# Patient Record
Sex: Female | Born: 1955 | Race: White | Hispanic: No | Marital: Married | Smoking: Never smoker
Health system: Southern US, Community
[De-identification: ages and names within clinical notes are randomized; demographics above are authoritative.]

## PROBLEM LIST (undated history)

## (undated) DIAGNOSIS — M1990 Unspecified osteoarthritis, unspecified site: Secondary | ICD-10-CM

## (undated) DIAGNOSIS — M8580 Other specified disorders of bone density and structure, unspecified site: Secondary | ICD-10-CM

## (undated) DIAGNOSIS — I219 Acute myocardial infarction, unspecified: Secondary | ICD-10-CM

## (undated) DIAGNOSIS — E119 Type 2 diabetes mellitus without complications: Secondary | ICD-10-CM

## (undated) DIAGNOSIS — E785 Hyperlipidemia, unspecified: Secondary | ICD-10-CM

## (undated) DIAGNOSIS — I10 Essential (primary) hypertension: Secondary | ICD-10-CM

## (undated) DIAGNOSIS — E669 Obesity, unspecified: Secondary | ICD-10-CM

## (undated) DIAGNOSIS — J3089 Other allergic rhinitis: Secondary | ICD-10-CM

## (undated) DIAGNOSIS — E039 Hypothyroidism, unspecified: Secondary | ICD-10-CM

## (undated) HISTORY — PX: TOOTH EXTRACTION: SUR596

## (undated) HISTORY — PX: ECTOPIC PREGNANCY SURGERY: SHX613

## (undated) HISTORY — DX: Hyperlipidemia, unspecified: E78.5

## (undated) HISTORY — PX: LAPAROSCOPY: SHX197

## (undated) HISTORY — PX: ABDOMINAL HYSTERECTOMY: SHX81

## (undated) HISTORY — DX: Obesity, unspecified: E66.9

## (undated) HISTORY — PX: DILATION AND CURETTAGE OF UTERUS: SHX78

## (undated) HISTORY — DX: Essential (primary) hypertension: I10

## (undated) HISTORY — DX: Unspecified osteoarthritis, unspecified site: M19.90

## (undated) HISTORY — DX: Other specified disorders of bone density and structure, unspecified site: M85.80

## (undated) HISTORY — DX: Type 2 diabetes mellitus without complications: E11.9

---

## 2004-02-16 ENCOUNTER — Ambulatory Visit: Payer: Self-pay | Admitting: Internal Medicine

## 2004-02-17 ENCOUNTER — Ambulatory Visit: Payer: Self-pay | Admitting: Internal Medicine

## 2007-08-26 ENCOUNTER — Emergency Department: Payer: Self-pay | Admitting: Emergency Medicine

## 2011-06-27 ENCOUNTER — Ambulatory Visit: Payer: Self-pay

## 2014-08-09 DIAGNOSIS — E119 Type 2 diabetes mellitus without complications: Secondary | ICD-10-CM

## 2014-08-09 DIAGNOSIS — E1129 Type 2 diabetes mellitus with other diabetic kidney complication: Secondary | ICD-10-CM | POA: Insufficient documentation

## 2014-08-09 DIAGNOSIS — E669 Obesity, unspecified: Secondary | ICD-10-CM | POA: Insufficient documentation

## 2014-08-09 DIAGNOSIS — R809 Proteinuria, unspecified: Secondary | ICD-10-CM

## 2014-08-09 DIAGNOSIS — M8580 Other specified disorders of bone density and structure, unspecified site: Secondary | ICD-10-CM | POA: Insufficient documentation

## 2014-08-09 DIAGNOSIS — M1990 Unspecified osteoarthritis, unspecified site: Secondary | ICD-10-CM

## 2014-08-09 DIAGNOSIS — E7800 Pure hypercholesterolemia, unspecified: Secondary | ICD-10-CM | POA: Insufficient documentation

## 2014-08-09 DIAGNOSIS — I10 Essential (primary) hypertension: Secondary | ICD-10-CM | POA: Insufficient documentation

## 2014-08-22 ENCOUNTER — Ambulatory Visit (INDEPENDENT_AMBULATORY_CARE_PROVIDER_SITE_OTHER): Payer: BC Managed Care – PPO | Admitting: Unknown Physician Specialty

## 2014-08-22 ENCOUNTER — Encounter: Payer: Self-pay | Admitting: Unknown Physician Specialty

## 2014-08-22 VITALS — BP 119/84 | HR 70 | Temp 98.3°F | Ht 63.2 in | Wt 184.4 lb

## 2014-08-22 DIAGNOSIS — E7800 Pure hypercholesterolemia, unspecified: Secondary | ICD-10-CM

## 2014-08-22 DIAGNOSIS — I10 Essential (primary) hypertension: Secondary | ICD-10-CM

## 2014-08-22 DIAGNOSIS — E119 Type 2 diabetes mellitus without complications: Secondary | ICD-10-CM

## 2014-08-22 NOTE — Progress Notes (Signed)
BP 119/84 mmHg  Pulse 70  Temp(Src) 98.3 F (36.8 C)  Ht 5' 3.2" (1.605 m)  Wt 184 lb 6.4 oz (83.643 kg)  BMI 32.47 kg/m2  SpO2 97%  LMP  (LMP Unknown)   Subjective:    Patient ID: Kim Hancock, female    DOB: 20-Aug-1955, 59 y.o.   MRN: [id number]  HPI: Kim Hancock is a 59 y.o. female  Chief Complaint  Patient presents with  . Diabetes  . Hypertension  . Hyperlipidemia   1.  DIABETES "my morning blood sugars have dropped"   Hypoglycemic episodes:  no  H8  Polydipsia/polyuria:  yes  H8 Visual disturbance:  no  R2  Chest pain:  no  R4  Paresthesias:  no  R10  Glucose Monitoring:  yes   Accucheck frequency:  once daily  Fasting glucose:  120-130s   HGB A1C: 7.1 3 months ago D1  GLUCOSE: 98 Retinal Examination:  Up to date  P1Dr. Clydene Pugh, no retinopathy noted per patient Foot Exam: Foot Exam Done on 07/09/13 P1 Aspirin:  Aspirin Therapy Not applicable on 01/22/10  2.  HYPERTENSION / HYPERLIPIDEMIA Patient is requesting refill(s). Satisfied with current treatment?  yes  H6  BP monitoring frequency:  not checking.  H5  BP medication side effects:  no P1 Duration of hyperlipidemia:  chronic  H4  Cholesterol medication side effects:  no P1  Medication compliance:  excellent compliance  P1  Aspirin:  Aspirin Therapy Not applicable on 01/22/10 Recent stressors:  no  H6   Recurrent headaches:  no  R10 Visual changes:  no  R2  Palpitations:  no  R4  Dyspnea:  no  R5  Chest pain:  no  R4  Lower extremity edema:  no  R4  Dizzy/lightheaded:  no  R10     Relevant past medical, surgical, family and social history reviewed and updated as indicated. Interim medical history since our last visit reviewed. Allergies and medications reviewed and updated.  Review of Systems  Per HPI unless specifically indicated above     Objective:    BP 119/84 mmHg  Pulse 70  Temp(Src) 98.3 F (36.8 C)  Ht 5' 3.2" (1.605 m)  Wt 184 lb 6.4 oz (83.643 kg)  BMI 32.47 kg/m2   SpO2 97%  LMP  (LMP Unknown)  Wt Readings from Last 3 Encounters:  08/22/14 184 lb 6.4 oz (83.643 kg)  05/30/14 189 lb (85.73 kg)    Physical Exam  Constitutional: She is oriented to person, place, and time. She appears well-developed and well-nourished. No distress.  HENT:  Head: Normocephalic and atraumatic.  Eyes: Conjunctivae and lids are normal. Right eye exhibits no discharge. Left eye exhibits no discharge. No scleral icterus.  Cardiovascular: Normal rate, regular rhythm and normal heart sounds.   Pulmonary/Chest: Effort normal and breath sounds normal. No respiratory distress.  Abdominal: Normal appearance. There is no splenomegaly or hepatomegaly.  Musculoskeletal: Normal range of motion.  Neurological: She is alert and oriented to person, place, and time.  Skin: Skin is intact. No rash noted. No pallor.  Psychiatric: She has a normal mood and affect. Her behavior is normal. Judgment and thought content normal.    No results found for this or any previous visit.    Assessment & Plan:   Problem List Items Addressed This Visit      Cardiovascular and Mediastinum   Hypertension     Endocrine   Diabetes mellitus - Primary    Hgb  A1C is 6.9 showing good control      Relevant Orders   Bayer DCA Hb A1c Waived   Comprehensive metabolic panel     Other   Hypercholesterolemia       Follow up plan: Return in about 3 months (around 11/22/2014) for diabetes/Htn/hypercholestoerol.

## 2014-08-22 NOTE — Assessment & Plan Note (Signed)
Hgb A1C is 6.9 showing good control

## 2014-08-23 LAB — BAYER DCA HB A1C WAIVED: HB A1C (BAYER DCA - WAIVED): 6.9 % (ref <7.0)

## 2014-08-23 LAB — COMPREHENSIVE METABOLIC PANEL
ALT: 16 IU/L (ref 0–32)
AST: 16 IU/L (ref 0–40)
Albumin/Globulin Ratio: 2.1 (ref 1.1–2.5)
Albumin: 4.5 g/dL (ref 3.6–4.8)
Alkaline Phosphatase: 52 IU/L (ref 39–117)
BUN / CREAT RATIO: 32 — AB (ref 11–26)
BUN: 29 mg/dL — AB (ref 8–27)
Bilirubin Total: 0.4 mg/dL (ref 0.0–1.2)
CALCIUM: 9.6 mg/dL (ref 8.7–10.3)
CO2: 22 mmol/L (ref 18–29)
CREATININE: 0.91 mg/dL (ref 0.57–1.00)
Chloride: 99 mmol/L (ref 97–108)
GFR calc Af Amer: 79 mL/min/{1.73_m2} (ref >59)
GFR calc non Af Amer: 68 mL/min/{1.73_m2} (ref >59)
Globulin, Total: 2.1 g/dL (ref 1.5–4.5)
Glucose: 107 mg/dL — ABNORMAL HIGH (ref 65–99)
POTASSIUM: 3.7 mmol/L (ref 3.5–5.2)
Sodium: 141 mmol/L (ref 134–144)
Total Protein: 6.6 g/dL (ref 6.0–8.5)

## 2014-08-29 ENCOUNTER — Other Ambulatory Visit: Payer: Self-pay | Admitting: Unknown Physician Specialty

## 2014-09-29 ENCOUNTER — Other Ambulatory Visit: Payer: Self-pay | Admitting: Unknown Physician Specialty

## 2014-10-29 ENCOUNTER — Other Ambulatory Visit: Payer: Self-pay | Admitting: Unknown Physician Specialty

## 2014-11-21 ENCOUNTER — Ambulatory Visit: Payer: BC Managed Care – PPO | Admitting: Unknown Physician Specialty

## 2014-11-23 ENCOUNTER — Encounter: Payer: Self-pay | Admitting: Unknown Physician Specialty

## 2014-11-23 ENCOUNTER — Ambulatory Visit (INDEPENDENT_AMBULATORY_CARE_PROVIDER_SITE_OTHER): Payer: BC Managed Care – PPO | Admitting: Unknown Physician Specialty

## 2014-11-23 VITALS — BP 124/80 | HR 69 | Temp 98.6°F | Ht 63.5 in | Wt 188.8 lb

## 2014-11-23 DIAGNOSIS — E7800 Pure hypercholesterolemia, unspecified: Secondary | ICD-10-CM

## 2014-11-23 DIAGNOSIS — I10 Essential (primary) hypertension: Secondary | ICD-10-CM | POA: Diagnosis not present

## 2014-11-23 DIAGNOSIS — M7521 Bicipital tendinitis, right shoulder: Secondary | ICD-10-CM | POA: Diagnosis not present

## 2014-11-23 DIAGNOSIS — E119 Type 2 diabetes mellitus without complications: Secondary | ICD-10-CM | POA: Diagnosis not present

## 2014-11-23 LAB — MICROALBUMIN, URINE WAIVED
CREATININE, URINE WAIVED: 50 mg/dL (ref 10–300)
Microalb, Ur Waived: 10 mg/L (ref 0–19)

## 2014-11-23 LAB — LIPID PANEL PICCOLO, WAIVED
CHOL/HDL RATIO PICCOLO,WAIVE: 3.5 mg/dL
Cholesterol Piccolo, Waived: 180 mg/dL (ref <200)
HDL Chol Piccolo, Waived: 52 mg/dL — ABNORMAL LOW (ref >59)
LDL CHOL CALC PICCOLO WAIVED: 60 mg/dL (ref <100)
Triglycerides Piccolo,Waived: 343 mg/dL — ABNORMAL HIGH (ref <150)
VLDL Chol Calc Piccolo,Waive: 69 mg/dL — ABNORMAL HIGH (ref <30)

## 2014-11-23 LAB — BAYER DCA HB A1C WAIVED: HB A1C: 7.3 % — AB (ref <7.0)

## 2014-11-23 NOTE — Assessment & Plan Note (Addendum)
Comprehensive metabolic panel ordered results pending Uric acid ordered results pending Stable, continue present medications.    

## 2014-11-23 NOTE — Progress Notes (Signed)
BP 124/80 mmHg  Pulse 69  Temp(Src) 98.6 F (37 C)  Ht 5' 3.5" (1.613 m)  Wt 188 lb 12.8 oz (85.639 kg)  BMI 32.92 kg/m2  SpO2 97%  LMP  (LMP Unknown)   Subjective:    Patient ID: Kim Hancock, female    DOB: November 01, 1955, 59 y.o.   MRN: [id number]  HPI: Kim Hancock is a 59 y.o. female  Chief Complaint  Patient presents with  . Diabetes  . Hyperlipidemia  . Hypertension   Diabetes This is a chronic problem medication compliance is excellent.  She is satisfied with the current treatment.  Fasting blood sugars at home ranging 120's.  Hgb A1C 6.9 last visit.  Pertinent negatives denies hypoglycemic events, chest pain, palpitations, dizziness/lightheadedness, shortness of breath, paresthesias, or polydipsia/polyura  Hypertension/Hyperlipidemia This is a chronic problem medication compliance is excellent.  She is satisfied with the current treatment.  Checks blood pressure at home weekly ranging 120's/70's.  Pertinent negatives denies recurrent headaches, visual changes, palpitations, dyspnea, chest pain, visual changes, edema, or dizziness/lightheadedness  Right Shoulder Pain The pt presents with intermittent right shoulder pain with slight decreased range of motion onset 4 months ago she does not remember injuring the right shoulder. The quality of pain is stabbing she is currently not having any pain. She thinks she may pulled a muscle.  Aggravating factors overuse.  Alleviating factors rest.  The pain is worse in the morning.  She has taken Ibuprofen for the pain unsure if it helps the pain or not. Pertinent negatives denies trauma, swelling, popping/clicking of right shoulder, or  numbness/tingling  Relevant past medical, surgical, family and social history reviewed and updated as indicated. Interim medical history since our last visit reviewed. Allergies and medications reviewed and updated.  Review of Systems  Constitutional: Negative.   HENT: Negative.   Eyes: Negative.    Respiratory: Positive for cough. Negative for apnea, choking, chest tightness, shortness of breath, wheezing and stridor.   Cardiovascular: Negative.   Gastrointestinal: Negative.   Endocrine: Negative.   Genitourinary: Negative.   Musculoskeletal: Positive for arthralgias. Negative for neck pain and neck stiffness.  Skin: Negative.   Allergic/Immunologic: Negative.   Neurological: Negative.   Hematological: Negative.   Psychiatric/Behavioral: Negative.     Per HPI unless specifically indicated above     Objective:    BP 124/80 mmHg  Pulse 69  Temp(Src) 98.6 F (37 C)  Ht 5' 3.5" (1.613 m)  Wt 188 lb 12.8 oz (85.639 kg)  BMI 32.92 kg/m2  SpO2 97%  LMP  (LMP Unknown)  Wt Readings from Last 3 Encounters:  11/23/14 188 lb 12.8 oz (85.639 kg)  08/22/14 184 lb 6.4 oz (83.643 kg)  05/30/14 189 lb (85.73 kg)    Physical Exam  Constitutional: She is oriented to person, place, and time. She appears well-developed and well-nourished. No distress.  HENT:  Head: Normocephalic and atraumatic.  Right Ear: External ear normal.  Left Ear: External ear normal.  Nose: Nose normal.  Neck: Normal range of motion. Neck supple.  Cardiovascular: Normal rate, regular rhythm, normal heart sounds and intact distal pulses.   Pulmonary/Chest: Effort normal and breath sounds normal. No respiratory distress. She has no wheezes. She has no rales. She exhibits no tenderness.  Musculoskeletal: She exhibits no edema.  Neer's and Empty Can Test positive, tenderness present with abduction and adduction  Neurological: She is alert and oriented to person, place, and time.  Skin: Skin is warm and dry.  No rash noted. She is not diaphoretic. No erythema. No pallor.  Psychiatric: She has a normal mood and affect. Her behavior is normal. Judgment and thought content normal.    Results for orders placed or performed in visit on 11/23/14  Lipid Panel Piccolo, [employer]  Result Value Ref Range   Cholesterol  Piccolo, Waived 180 <200 mg/dL   HDL Chol Piccolo, Waived 52 (L) >59 mg/dL   Triglycerides Piccolo,Waived 343 (H) <150 mg/dL   Chol/HDL Ratio Piccolo,Waive 3.5 mg/dL   LDL Chol Calc Piccolo Waived 60 <100 mg/dL   VLDL Chol Calc Piccolo,Waive 69 (H) <30 mg/dL  Bayer DCA Hb Z6X Waived  Result Value Ref Range   Bayer DCA Hb A1c Waived 7.3 (H) <7.0 %  Microalbumin, Urine Waived  Result Value Ref Range   Microalb, Ur Waived 10 0 - 19 mg/L   Creatinine, Urine Waived 50 10 - 300 mg/dL   Microalb/Creat Ratio <30 <30 mg/g      Assessment & Plan:   Problem List Items Addressed This Visit      Unprioritized   Diabetes mellitus (HCC) - Primary    Hgb A1C reviewed results 7.3 will continue to limit carbohydrate intake Continue current medication      Hypertension    Comprehensive metabolic panel ordered results pending Uric acid ordered results pending      Hypercholesterolemia    Lipid panel results reviewed LDL 60Total Cholesterol 180 Stable, continue present medications.        Biceps tendonitis on right    Pt agreeable to look up stretching exercises online to perform at home  If symptoms do not improve will Refer to Physical Therapy May continue using otc NSAID's as needed for pain       Other Visit Diagnoses    Type 2 diabetes mellitus without complication (HCC)            Follow up plan: Return in about 3 months (around 02/23/2015).

## 2014-11-23 NOTE — Assessment & Plan Note (Signed)
Pt agreeable to look up stretching exercises online to perform at home  If symptoms do not improve will Refer to Physical Therapy May continue using otc NSAID's as needed for pain

## 2014-11-23 NOTE — Assessment & Plan Note (Addendum)
Hgb A1C reviewed results 7.3 will continue to limit carbohydrate intake Continue current medication

## 2014-11-23 NOTE — Assessment & Plan Note (Addendum)
Lipid panel results reviewed LDL 60Total Cholesterol 180 Stable, continue present medications.

## 2014-11-24 LAB — COMPREHENSIVE METABOLIC PANEL
ALT: 28 IU/L (ref 0–32)
AST: 19 IU/L (ref 0–40)
Albumin/Globulin Ratio: 2.2 (ref 1.1–2.5)
Albumin: 4.4 g/dL (ref 3.6–4.8)
Alkaline Phosphatase: 54 IU/L (ref 39–117)
BILIRUBIN TOTAL: 0.4 mg/dL (ref 0.0–1.2)
BUN / CREAT RATIO: 36 — AB (ref 11–26)
BUN: 24 mg/dL (ref 8–27)
CALCIUM: 9.6 mg/dL (ref 8.7–10.3)
CO2: 26 mmol/L (ref 18–29)
Chloride: 98 mmol/L (ref 97–106)
Creatinine, Ser: 0.67 mg/dL (ref 0.57–1.00)
GFR calc Af Amer: 110 mL/min/{1.73_m2} (ref >59)
GFR calc non Af Amer: 95 mL/min/{1.73_m2} (ref >59)
GLUCOSE: 192 mg/dL — AB (ref 65–99)
Globulin, Total: 2 g/dL (ref 1.5–4.5)
POTASSIUM: 3.7 mmol/L (ref 3.5–5.2)
SODIUM: 138 mmol/L (ref 136–144)
TOTAL PROTEIN: 6.4 g/dL (ref 6.0–8.5)

## 2014-11-24 LAB — URIC ACID: URIC ACID: 3.6 mg/dL (ref 2.5–7.1)

## 2014-11-26 ENCOUNTER — Other Ambulatory Visit: Payer: Self-pay | Admitting: Unknown Physician Specialty

## 2014-12-24 ENCOUNTER — Other Ambulatory Visit: Payer: Self-pay | Admitting: Unknown Physician Specialty

## 2014-12-24 ENCOUNTER — Other Ambulatory Visit: Payer: Self-pay | Admitting: Family Medicine

## 2014-12-26 NOTE — Telephone Encounter (Signed)
Your patient 

## 2015-01-19 ENCOUNTER — Other Ambulatory Visit: Payer: Self-pay | Admitting: Unknown Physician Specialty

## 2015-02-24 ENCOUNTER — Other Ambulatory Visit: Payer: Self-pay | Admitting: Unknown Physician Specialty

## 2015-02-24 ENCOUNTER — Other Ambulatory Visit: Payer: Self-pay | Admitting: Family Medicine

## 2015-02-24 NOTE — Telephone Encounter (Signed)
You patient.

## 2015-03-01 ENCOUNTER — Ambulatory Visit: Payer: BC Managed Care – PPO | Admitting: Unknown Physician Specialty

## 2015-03-03 ENCOUNTER — Encounter: Payer: Self-pay | Admitting: Unknown Physician Specialty

## 2015-03-03 ENCOUNTER — Ambulatory Visit (INDEPENDENT_AMBULATORY_CARE_PROVIDER_SITE_OTHER): Payer: BC Managed Care – PPO | Admitting: Unknown Physician Specialty

## 2015-03-03 VITALS — BP 117/78 | HR 84 | Temp 98.3°F | Ht 63.6 in | Wt 184.6 lb

## 2015-03-03 DIAGNOSIS — Z23 Encounter for immunization: Secondary | ICD-10-CM

## 2015-03-03 DIAGNOSIS — E119 Type 2 diabetes mellitus without complications: Secondary | ICD-10-CM

## 2015-03-03 DIAGNOSIS — I10 Essential (primary) hypertension: Secondary | ICD-10-CM | POA: Diagnosis not present

## 2015-03-03 DIAGNOSIS — E7800 Pure hypercholesterolemia, unspecified: Secondary | ICD-10-CM

## 2015-03-03 LAB — BAYER DCA HB A1C WAIVED: HB A1C (BAYER DCA - WAIVED): 7.5 % — ABNORMAL HIGH (ref <7.0)

## 2015-03-03 MED ORDER — EMPAGLIFLOZIN 25 MG PO TABS: 25.0000 mg | ORAL_TABLET | Freq: Every day | ORAL | Status: DC

## 2015-03-03 NOTE — Progress Notes (Signed)
BP 117/78 mmHg  Pulse 84  Temp(Src) 98.3 F (36.8 C)  Ht 5' 3.6" (1.615 m)  Wt 184 lb 9.6 oz (83.734 kg)  BMI 32.10 kg/m2  SpO2 95%  LMP  (LMP Unknown)   Subjective:    Patient ID: Kim Hancock, female    DOB: 05/27/1955, 60 y.o.   MRN: [id number]  HPI: Kim Hancock is a 60 y.o. female  Chief Complaint  Patient presents with  . Diabetes  . Hyperlipidemia  . Hypertension   Diabetes:  Using medications without difficulties: Hypoglycemic episodes Hyperglycemic episodes Feet problems: normal Blood Sugars averaging: "all over the place" Eye exam within last year: Last A1c: 7.3  Hypertension:    Using medication without problems or lightheadedness  No nhest pain with exertion: No edema No shortness of breath Average home BPs:   Elevated Cholesterol: Using medications without problems: No muscle aches  Diet compliance: Exercise: Supplements?     Relevant past medical, surgical, family and social history reviewed and updated as indicated. Interim medical history since our last visit reviewed. Allergies and medications reviewed and updated.  Review of Systems  Constitutional: Negative.   HENT: Negative.   Eyes: Negative.   Respiratory: Negative.   Cardiovascular: Negative.   Gastrointestinal: Negative.   Endocrine: Negative.   Genitourinary: Negative.   Musculoskeletal: Negative.   Skin: Negative.   Allergic/Immunologic: Negative.   Neurological: Negative.   Hematological: Negative.   Psychiatric/Behavioral: Negative.     Per HPI unless specifically indicated above     Objective:    BP 117/78 mmHg  Pulse 84  Temp(Src) 98.3 F (36.8 C)  Ht 5' 3.6" (1.615 m)  Wt 184 lb 9.6 oz (83.734 kg)  BMI 32.10 kg/m2  SpO2 95%  LMP  (LMP Unknown)  Wt Readings from Last 3 Encounters:  03/03/15 184 lb 9.6 oz (83.734 kg)  11/23/14 188 lb 12.8 oz (85.639 kg)  08/22/14 184 lb 6.4 oz (83.643 kg)    Physical Exam  Constitutional: She is oriented to  person, place, and time. She appears well-developed and well-nourished. No distress.  HENT:  Head: Normocephalic and atraumatic.  Eyes: Conjunctivae and lids are normal. Right eye exhibits no discharge. Left eye exhibits no discharge. No scleral icterus.  Neck: Normal range of motion. Neck supple. No JVD present. Carotid bruit is not present.  Cardiovascular: Normal rate, regular rhythm and normal heart sounds.   Pulmonary/Chest: Effort normal and breath sounds normal.  Abdominal: Normal appearance. There is no splenomegaly or hepatomegaly.  Musculoskeletal: Normal range of motion.  Neurological: She is alert and oriented to person, place, and time.  Skin: Skin is warm, dry and intact. No rash noted. No pallor.  Psychiatric: She has a normal mood and affect. Her behavior is normal. Judgment and thought content normal.    Results for orders placed or performed in visit on 11/23/14  Lipid Panel Piccolo, [employer]  Result Value Ref Range   Cholesterol Piccolo, Waived 180 <200 mg/dL   HDL Chol Piccolo, Waived 52 (L) >59 mg/dL   Triglycerides Piccolo,Waived 343 (H) <150 mg/dL   Chol/HDL Ratio Piccolo,Waive 3.5 mg/dL   LDL Chol Calc Piccolo Waived 60 <100 mg/dL   VLDL Chol Calc Piccolo,Waive 69 (H) <30 mg/dL  Bayer DCA Hb H0Q Waived  Result Value Ref Range   Bayer DCA Hb A1c Waived 7.3 (H) <7.0 %  Comprehensive metabolic panel  Result Value Ref Range   Glucose 192 (H) 65 - 99 mg/dL   BUN  24 8 - 27 mg/dL   Creatinine, Ser 1.61 0.57 - 1.00 mg/dL   GFR calc non Af Amer 95 >59 mL/min/1.73   GFR calc Af Amer 110 >59 mL/min/1.73   BUN/Creatinine Ratio 36 (H) 11 - 26   Sodium 138 136 - 144 mmol/L   Potassium 3.7 3.5 - 5.2 mmol/L   Chloride 98 97 - 106 mmol/L   CO2 26 18 - 29 mmol/L   Calcium 9.6 8.7 - 10.3 mg/dL   Total Protein 6.4 6.0 - 8.5 g/dL   Albumin 4.4 3.6 - 4.8 g/dL   Globulin, Total 2.0 1.5 - 4.5 g/dL   Albumin/Globulin Ratio 2.2 1.1 - 2.5   Bilirubin Total 0.4 0.0 - 1.2 mg/dL    Alkaline Phosphatase 54 39 - 117 IU/L   AST 19 0 - 40 IU/L   ALT 28 0 - 32 IU/L  Microalbumin, Urine Waived  Result Value Ref Range   Microalb, Ur Waived 10 0 - 19 mg/L   Creatinine, Urine Waived 50 10 - 300 mg/dL   Microalb/Creat Ratio <30 <30 mg/g  Uric acid  Result Value Ref Range   Uric Acid 3.6 2.5 - 7.1 mg/dL      Assessment & Plan:   Problem List Items Addressed This Visit      Unprioritized   Diabetes mellitus (HCC)    Hgb A1C is 7.5.  Need to change from [place] to [place] for insurance reasons.  Unable to increase Metformin.  Recheck in 3 months      Relevant Medications   empagliflozin (JARDIANCE) 25 MG TABS tablet   Other Relevant Orders   Comprehensive metabolic panel   Bayer DCA Hb W9U Waived   Hypertension   Relevant Orders   Comprehensive metabolic panel   Hypercholesterolemia    Other Visit Diagnoses    Need for shingles vaccine    -  Primary    Relevant Orders    Varicella-zoster vaccine subcutaneous (Completed)        Follow up plan: Return in about 3 months (around 06/01/2015).

## 2015-03-03 NOTE — Assessment & Plan Note (Addendum)
Hgb A1C is 7.5.  Need to change from [place] to [place] for insurance reasons.  Unable to increase Metformin.  Recheck in 3 months

## 2015-03-04 LAB — COMPREHENSIVE METABOLIC PANEL
A/G RATIO: 1.9 (ref 1.1–2.5)
ALBUMIN: 4.2 g/dL (ref 3.6–4.8)
ALK PHOS: 50 IU/L (ref 39–117)
ALT: 21 IU/L (ref 0–32)
AST: 21 IU/L (ref 0–40)
BUN / CREAT RATIO: 25 (ref 11–26)
BUN: 21 mg/dL (ref 8–27)
Bilirubin Total: 0.5 mg/dL (ref 0.0–1.2)
CO2: 26 mmol/L (ref 18–29)
Calcium: 9.8 mg/dL (ref 8.7–10.3)
Chloride: 99 mmol/L (ref 96–106)
Creatinine, Ser: 0.84 mg/dL (ref 0.57–1.00)
GFR calc Af Amer: 87 mL/min/{1.73_m2} (ref >59)
GFR calc non Af Amer: 75 mL/min/{1.73_m2} (ref >59)
GLOBULIN, TOTAL: 2.2 g/dL (ref 1.5–4.5)
Glucose: 206 mg/dL — ABNORMAL HIGH (ref 65–99)
POTASSIUM: 4.2 mmol/L (ref 3.5–5.2)
SODIUM: 142 mmol/L (ref 134–144)
Total Protein: 6.4 g/dL (ref 6.0–8.5)

## 2015-03-18 ENCOUNTER — Other Ambulatory Visit: Payer: Self-pay | Admitting: Unknown Physician Specialty

## 2015-05-18 ENCOUNTER — Other Ambulatory Visit: Payer: Self-pay | Admitting: Unknown Physician Specialty

## 2015-05-29 ENCOUNTER — Other Ambulatory Visit: Payer: Self-pay | Admitting: Unknown Physician Specialty

## 2015-06-02 ENCOUNTER — Ambulatory Visit: Payer: BC Managed Care – PPO | Admitting: Unknown Physician Specialty

## 2015-06-05 ENCOUNTER — Encounter: Payer: Self-pay | Admitting: Unknown Physician Specialty

## 2015-06-05 ENCOUNTER — Ambulatory Visit (INDEPENDENT_AMBULATORY_CARE_PROVIDER_SITE_OTHER): Payer: BC Managed Care – PPO | Admitting: Unknown Physician Specialty

## 2015-06-05 VITALS — BP 120/84 | HR 93 | Temp 98.2°F | Ht 63.3 in | Wt 180.2 lb

## 2015-06-05 DIAGNOSIS — E119 Type 2 diabetes mellitus without complications: Secondary | ICD-10-CM | POA: Diagnosis not present

## 2015-06-05 DIAGNOSIS — Z0000 Encounter for general adult medical examination without abnormal findings: Secondary | ICD-10-CM

## 2015-06-05 DIAGNOSIS — E7800 Pure hypercholesterolemia, unspecified: Secondary | ICD-10-CM | POA: Diagnosis not present

## 2015-06-05 DIAGNOSIS — I10 Essential (primary) hypertension: Secondary | ICD-10-CM | POA: Diagnosis not present

## 2015-06-05 MED ORDER — LEVOTHYROXINE SODIUM 100 MCG PO TABS: 100.0000 ug | ORAL_TABLET | Freq: Every day | ORAL | Status: DC

## 2015-06-05 MED ORDER — ROSUVASTATIN CALCIUM 20 MG PO TABS: 20.0000 mg | ORAL_TABLET | Freq: Every day | ORAL | Status: DC

## 2015-06-05 MED ORDER — EMPAGLIFLOZIN 25 MG PO TABS: 25.0000 mg | ORAL_TABLET | Freq: Every day | ORAL | Status: DC

## 2015-06-05 MED ORDER — METFORMIN HCL 500 MG PO TABS: 500.0000 mg | ORAL_TABLET | Freq: Two times a day (BID) | ORAL | Status: DC

## 2015-06-05 NOTE — Assessment & Plan Note (Signed)
Non fasting.  Lipid sent out of the office

## 2015-06-05 NOTE — Assessment & Plan Note (Signed)
Hgb A1C 7.4 but pt feels great and losing weight.  Will just recheck in another 3 months

## 2015-06-05 NOTE — Progress Notes (Signed)
BP 120/84 mmHg  Pulse 93  Temp(Src) 98.2 F (36.8 C)  Ht 5' 3.3" (1.608 m)  Wt 180 lb 3.2 oz (81.738 kg)  BMI 31.61 kg/m2  SpO2 98%  LMP  (LMP Unknown)   Subjective:    Patient ID: Kim Hancock, female    DOB: [birth year], 60 y.o.   MRN: [id number]  HPI: Kim Hancock is a 60 y.o. female  Chief Complaint  Patient presents with  . Diabetes  . Hyperlipidemia  . Hypertension  . Labs Only    pt states she is interested in HIV and Hep C screening, orders entered   Diabetes:  Using medications without difficulties No hypoglycemic episodes No hyperglycemic episodes Feet problems: none Blood Sugars averaging: 110 last 2 mornings eye exam within last year up to date  Hypertension:  Using medications without difficulty Average home BPs "about what they are here"  Using medication without problems or lightheadedness No chest pain with exertion or shortness of breath No Edema   Elevated Cholesterol: Using medications without problems No Muscle aches Diet compliance: Watching what she eats Exercise: Using a recumbent bike   Relevant past medical, surgical, family and social history reviewed and updated as indicated. Interim medical history since our last visit reviewed. Allergies and medications reviewed and updated.  Review of Systems  Per HPI unless specifically indicated above     Objective:    BP 120/84 mmHg  Pulse 93  Temp(Src) 98.2 F (36.8 C)  Ht 5' 3.3" (1.608 m)  Wt 180 lb 3.2 oz (81.738 kg)  BMI 31.61 kg/m2  SpO2 98%  LMP  (LMP Unknown)  Wt Readings from Last 3 Encounters:  06/05/15 180 lb 3.2 oz (81.738 kg)  03/03/15 184 lb 9.6 oz (83.734 kg)  11/23/14 188 lb 12.8 oz (85.639 kg)    Physical Exam  Constitutional: She is oriented to person, place, and time. She appears well-developed and well-nourished. No distress.  HENT:  Head: Normocephalic and atraumatic.  Eyes: Conjunctivae and lids are normal. Right eye exhibits no discharge. Left eye  exhibits no discharge. No scleral icterus.  Neck: Normal range of motion. Neck supple. No JVD present. Carotid bruit is not present.  Cardiovascular: Normal rate, regular rhythm and normal heart sounds.   Pulmonary/Chest: Effort normal and breath sounds normal.  Abdominal: Normal appearance. There is no splenomegaly or hepatomegaly.  Musculoskeletal: Normal range of motion.  Neurological: She is alert and oriented to person, place, and time.  Skin: Skin is warm, dry and intact. No rash noted. No pallor.  Psychiatric: She has a normal mood and affect. Her behavior is normal. Judgment and thought content normal.   Diabetic Foot Exam - Simple   Simple Foot Form  Diabetic Foot exam was performed with the following findings:  Yes 06/05/2015  2:01 PM  Visual Inspection  No deformities, no ulcerations, no other skin breakdown bilaterally:  Yes  Sensation Testing  Intact to touch and monofilament testing bilaterally:  Yes  Pulse Check  Posterior Tibialis and Dorsalis pulse intact bilaterally:  Yes  Comments       Results for orders placed or performed in visit on 03/03/15  Comprehensive metabolic panel  Result Value Ref Range   Glucose 206 (H) 65 - 99 mg/dL   BUN 21 8 - 27 mg/dL   Creatinine, Ser 4.090.84 0.57 - 1.00 mg/dL   GFR calc non Af Amer 75 >59 mL/min/1.73   GFR calc Af Amer 87 >59 mL/min/1.73   BUN/Creatinine  Ratio 25 11 - 26   Sodium 142 134 - 144 mmol/L   Potassium 4.2 3.5 - 5.2 mmol/L   Chloride 99 96 - 106 mmol/L   CO2 26 18 - 29 mmol/L   Calcium 9.8 8.7 - 10.3 mg/dL   Total Protein 6.4 6.0 - 8.5 g/dL   Albumin 4.2 3.6 - 4.8 g/dL   Globulin, Total 2.2 1.5 - 4.5 g/dL   Albumin/Globulin Ratio 1.9 1.1 - 2.5   Bilirubin Total 0.5 0.0 - 1.2 mg/dL   Alkaline Phosphatase 50 39 - 117 IU/L   AST 21 0 - 40 IU/L   ALT 21 0 - 32 IU/L  Bayer DCA Hb A1c Waived  Result Value Ref Range   Bayer DCA Hb A1c Waived 7.5 (H) <7.0 %      Assessment & Plan:   Problem List Items Addressed  This Visit      Unprioritized   Diabetes mellitus (HCC)    Hgb A1C 7.4 but pt feels great and losing weight.  Will just recheck in another 3 months      Relevant Medications   empagliflozin (JARDIANCE) 25 MG TABS tablet   metFORMIN (GLUCOPHAGE) 500 MG tablet   rosuvastatin (CRESTOR) 20 MG tablet   Other Relevant Orders   Comprehensive metabolic panel   Bayer DCA Hb Z6X Waived   Hypercholesterolemia    Non fasting.  Lipid sent out of the office      Relevant Medications   rosuvastatin (CRESTOR) 20 MG tablet   Other Relevant Orders   Lipid Panel Piccolo, Waived   Hypertension    Stable, continue present medications.        Relevant Medications   rosuvastatin (CRESTOR) 20 MG tablet   Other Relevant Orders   Comprehensive metabolic panel    Other Visit Diagnoses    Health care maintenance    -  Primary    Relevant Orders    Hepatitis C antibody    HIV antibody        Follow up plan: Return in about 3 months (around 09/05/2015) for TSH, Lipid, A1C and CMP next visit.

## 2015-06-05 NOTE — Assessment & Plan Note (Signed)
Stable, continue present medications.   

## 2015-06-06 LAB — COMPREHENSIVE METABOLIC PANEL
A/G RATIO: 2 (ref 1.2–2.2)
ALBUMIN: 4.4 g/dL (ref 3.6–4.8)
ALK PHOS: 53 IU/L (ref 39–117)
ALT: 19 IU/L (ref 0–32)
AST: 19 IU/L (ref 0–40)
BILIRUBIN TOTAL: 0.5 mg/dL (ref 0.0–1.2)
BUN / CREAT RATIO: 33 — AB (ref 12–28)
BUN: 22 mg/dL (ref 8–27)
CO2: 21 mmol/L (ref 18–29)
CREATININE: 0.66 mg/dL (ref 0.57–1.00)
Calcium: 9.9 mg/dL (ref 8.7–10.3)
Chloride: 98 mmol/L (ref 96–106)
GFR calc Af Amer: 110 mL/min/{1.73_m2} (ref >59)
GFR calc non Af Amer: 96 mL/min/{1.73_m2} (ref >59)
GLOBULIN, TOTAL: 2.2 g/dL (ref 1.5–4.5)
Glucose: 216 mg/dL — ABNORMAL HIGH (ref 65–99)
POTASSIUM: 3.7 mmol/L (ref 3.5–5.2)
SODIUM: 143 mmol/L (ref 134–144)
Total Protein: 6.6 g/dL (ref 6.0–8.5)

## 2015-06-06 LAB — HIV ANTIBODY (ROUTINE TESTING W REFLEX): HIV Screen 4th Generation wRfx: NONREACTIVE

## 2015-06-06 LAB — HEPATITIS C ANTIBODY

## 2015-06-07 LAB — LIPID PANEL PICCOLO, WAIVED

## 2015-06-07 LAB — LIPID PANEL W/O CHOL/HDL RATIO
CHOLESTEROL TOTAL: 184 mg/dL (ref 100–199)
HDL: 47 mg/dL (ref >39)
LDL CALC: 60 mg/dL (ref 0–99)
TRIGLYCERIDES: 387 mg/dL — AB (ref 0–149)
VLDL Cholesterol Cal: 77 mg/dL — ABNORMAL HIGH (ref 5–40)

## 2015-06-07 LAB — SPECIMEN STATUS REPORT

## 2015-06-08 LAB — BAYER DCA HB A1C WAIVED: HB A1C: 7.4 % — AB (ref <7.0)

## 2015-08-16 ENCOUNTER — Other Ambulatory Visit: Payer: Self-pay | Admitting: Unknown Physician Specialty

## 2015-09-06 ENCOUNTER — Ambulatory Visit: Payer: BC Managed Care – PPO | Admitting: Unknown Physician Specialty

## 2015-09-18 ENCOUNTER — Ambulatory Visit: Payer: BC Managed Care – PPO | Admitting: Unknown Physician Specialty

## 2015-11-01 ENCOUNTER — Ambulatory Visit (INDEPENDENT_AMBULATORY_CARE_PROVIDER_SITE_OTHER): Payer: BC Managed Care – PPO | Admitting: Unknown Physician Specialty

## 2015-11-01 ENCOUNTER — Other Ambulatory Visit: Payer: Self-pay

## 2015-11-01 ENCOUNTER — Encounter: Payer: Self-pay | Admitting: Unknown Physician Specialty

## 2015-11-01 VITALS — BP 135/87 | HR 65 | Temp 98.1°F | Ht 63.5 in | Wt 179.4 lb

## 2015-11-01 DIAGNOSIS — I10 Essential (primary) hypertension: Secondary | ICD-10-CM | POA: Diagnosis not present

## 2015-11-01 DIAGNOSIS — E119 Type 2 diabetes mellitus without complications: Secondary | ICD-10-CM

## 2015-11-01 DIAGNOSIS — Z0000 Encounter for general adult medical examination without abnormal findings: Secondary | ICD-10-CM

## 2015-11-01 LAB — HEMOGLOBIN A1C: Hemoglobin A1C: 6.8

## 2015-11-01 LAB — BAYER DCA HB A1C WAIVED: HB A1C: 6.8 % (ref <7.0)

## 2015-11-01 NOTE — Patient Instructions (Signed)
Please do call to schedule your mammogram; the number to schedule one at either [facility] or [facility] is [phone]   

## 2015-11-01 NOTE — Assessment & Plan Note (Signed)
Hgb A1C 6.8.  Continue present meds

## 2015-11-01 NOTE — Progress Notes (Signed)
BP 135/87 (BP Location: Left Arm, Patient Position: Sitting, Cuff Size: Large)   Pulse 65   Temp 98.1 F (36.7 C)   Ht 5' 3.5" (1.613 m)   Wt 179 lb 6.4 oz (81.4 kg)   LMP  (LMP Unknown)   SpO2 97%   BMI 31.28 kg/m    Subjective:    Patient ID: Kim Hancock, female    DOB: Aug 13, 1955, 60 y.o.   MRN: [id number]  HPI: Kim Hancock is a 60 y.o. female  Chief Complaint  Patient presents with  . Diabetes    pt states last eye exam in chart is correct   . Hyperlipidemia  . Hypertension  . URI    pt states she has been having congestion, cough, sinus pressure, and headache. States sympotms started Monday.    Diabetes: Using medications without difficulties No hypoglycemic episodes No hyperglycemic episodes Feet problems: none Blood Sugars averaging: high last couple of weeks eye exam within last year Last Hgb A1C: 7.4  Hypertension  Using medications without difficulty Average home BPs 120s/70s  Using medication without problems or lightheadedness No chest pain with exertion or shortness of breath No Edema  Elevated Cholesterol Using medications without problems No Muscle aches  Diet: No change Exercise: Walking and using recumbant  Relevant past medical, surgical, family and social history reviewed and updated as indicated. Interim medical history since our last visit reviewed. Allergies and medications reviewed and updated.  Review of Systems  Per HPI unless specifically indicated above     Objective:    BP 135/87 (BP Location: Left Arm, Patient Position: Sitting, Cuff Size: Large)   Pulse 65   Temp 98.1 F (36.7 C)   Ht 5' 3.5" (1.613 m)   Wt 179 lb 6.4 oz (81.4 kg)   LMP  (LMP Unknown)   SpO2 97%   BMI 31.28 kg/m   Wt Readings from Last 3 Encounters:  11/01/15 179 lb 6.4 oz (81.4 kg)  06/05/15 180 lb 3.2 oz (81.7 kg)  03/03/15 184 lb 9.6 oz (83.7 kg)    Physical Exam  Constitutional: She is oriented to person, place, and time. She appears  well-developed and well-nourished. No distress.  HENT:  Head: Normocephalic and atraumatic.  Eyes: Conjunctivae and lids are normal. Right eye exhibits no discharge. Left eye exhibits no discharge. No scleral icterus.  Neck: Normal range of motion. Neck supple. No JVD present. Carotid bruit is not present.  Cardiovascular: Normal rate, regular rhythm and normal heart sounds.   Pulmonary/Chest: Effort normal and breath sounds normal.  Abdominal: Normal appearance. There is no splenomegaly or hepatomegaly.  Musculoskeletal: Normal range of motion.  Neurological: She is alert and oriented to person, place, and time.  Skin: Skin is warm, dry and intact. No rash noted. No pallor.  Psychiatric: She has a normal mood and affect. Her behavior is normal. Judgment and thought content normal.    Results for orders placed or performed in visit on 06/05/15  Hepatitis C antibody  Result Value Ref Range   Hep C Virus Ab <0.1 0.0 - 0.9 s/co ratio  HIV antibody  Result Value Ref Range   HIV Screen 4th Generation wRfx Non Reactive Non Reactive  Comprehensive metabolic panel  Result Value Ref Range   Glucose 216 (H) 65 - 99 mg/dL   BUN 22 8 - 27 mg/dL   Creatinine, Ser 0.860.66 0.57 - 1.00 mg/dL   GFR calc non Af Amer 96 >59 mL/min/1.73   GFR  calc Af Amer 110 >59 mL/min/1.73   BUN/Creatinine Ratio 33 (H) 12 - 28   Sodium 143 134 - 144 mmol/L   Potassium 3.7 3.5 - 5.2 mmol/L   Chloride 98 96 - 106 mmol/L   CO2 21 18 - 29 mmol/L   Calcium 9.9 8.7 - 10.3 mg/dL   Total Protein 6.6 6.0 - 8.5 g/dL   Albumin 4.4 3.6 - 4.8 g/dL   Globulin, Total 2.2 1.5 - 4.5 g/dL   Albumin/Globulin Ratio 2.0 1.2 - 2.2   Bilirubin Total 0.5 0.0 - 1.2 mg/dL   Alkaline Phosphatase 53 39 - 117 IU/L   AST 19 0 - 40 IU/L   ALT 19 0 - 32 IU/L  Bayer DCA Hb A1c Waived  Result Value Ref Range   Bayer DCA Hb A1c Waived 7.4 (H) <7.0 %  Lipid Panel Piccolo, Waived  Result Value Ref Range   Cholesterol Piccolo, Waived WILL  FOLLOW    HDL Chol Piccolo, Waived WILL FOLLOW    Triglycerides Piccolo,Waived WILL FOLLOW    Chol/HDL Ratio Piccolo,Waive WILL FOLLOW    LDL Chol Calc Piccolo Waived WILL FOLLOW    VLDL Chol Calc Piccolo,Waive WILL FOLLOW   Lipid Panel w/o Chol/HDL Ratio  Result Value Ref Range   Cholesterol, Total 184 100 - 199 mg/dL   Triglycerides 161 (H) 0 - 149 mg/dL   HDL 47 >09 mg/dL   VLDL Cholesterol Cal 77 (H) 5 - 40 mg/dL   LDL Calculated 60 0 - 99 mg/dL  Specimen status report  Result Value Ref Range   specimen status report Comment       Assessment & Plan:   Problem List Items Addressed This Visit      Unprioritized   Diabetes mellitus (HCC) - Primary    Hgb A1C 6.8.  Continue present meds      Relevant Orders   Bayer DCA Hb A1c Waived   Comprehensive metabolic panel   Hypertension    Stable, continue present medications.         Other Visit Diagnoses    Routine general medical examination at a health care facility       Relevant Orders   MM DIGITAL SCREENING BILATERAL       Follow up plan: Return in about 3 months (around 01/31/2016) for physica.

## 2015-11-01 NOTE — Assessment & Plan Note (Signed)
Stable, continue present medications.   

## 2015-11-02 LAB — COMPREHENSIVE METABOLIC PANEL
ALK PHOS: 65 IU/L (ref 39–117)
ALT: 14 IU/L (ref 0–32)
AST: 14 IU/L (ref 0–40)
Albumin/Globulin Ratio: 1.9 (ref 1.2–2.2)
Albumin: 4.3 g/dL (ref 3.6–4.8)
BILIRUBIN TOTAL: 0.4 mg/dL (ref 0.0–1.2)
BUN/Creatinine Ratio: 28 (ref 12–28)
BUN: 19 mg/dL (ref 8–27)
CHLORIDE: 98 mmol/L (ref 96–106)
CO2: 26 mmol/L (ref 18–29)
CREATININE: 0.69 mg/dL (ref 0.57–1.00)
Calcium: 9.3 mg/dL (ref 8.7–10.3)
GFR calc Af Amer: 108 mL/min/{1.73_m2} (ref >59)
GFR calc non Af Amer: 94 mL/min/{1.73_m2} (ref >59)
GLUCOSE: 130 mg/dL — AB (ref 65–99)
Globulin, Total: 2.3 g/dL (ref 1.5–4.5)
Potassium: 3.8 mmol/L (ref 3.5–5.2)
Sodium: 141 mmol/L (ref 134–144)
Total Protein: 6.6 g/dL (ref 6.0–8.5)

## 2015-11-03 NOTE — Progress Notes (Signed)
Normal labs.  Pt notified through mychart

## 2015-11-25 ENCOUNTER — Telehealth: Payer: Self-pay | Admitting: Unknown Physician Specialty

## 2015-11-27 ENCOUNTER — Other Ambulatory Visit: Payer: Self-pay | Admitting: Unknown Physician Specialty

## 2015-11-27 MED ORDER — METFORMIN HCL 500 MG PO TABS
500.0000 mg | ORAL_TABLET | Freq: Two times a day (BID) | ORAL | 0 refills | Status: DC
Start: 2015-11-27 — End: 2016-02-18

## 2015-11-27 MED ORDER — LOSARTAN POTASSIUM-HCTZ 100-12.5 MG PO TABS: 1.0000 | ORAL_TABLET | Freq: Every day | ORAL | 3 refills | Status: DC

## 2016-01-05 ENCOUNTER — Ambulatory Visit
Admission: RE | Admit: 2016-01-05 | Discharge: 2016-01-05 | Disposition: A | Discharge status: Home / Self Care | Payer: BC Managed Care – PPO | Source: Ambulatory Visit | Attending: Unknown Physician Specialty | Admitting: Unknown Physician Specialty

## 2016-01-05 DIAGNOSIS — Z0000 Encounter for general adult medical examination without abnormal findings: Secondary | ICD-10-CM

## 2016-01-05 DIAGNOSIS — Z1231 Encounter for screening mammogram for malignant neoplasm of breast: Secondary | ICD-10-CM | POA: Insufficient documentation

## 2016-01-09 NOTE — Telephone Encounter (Signed)
Please call in her metformin and losartan-hctz to her pharmacy. Thanks!

## 2016-01-09 NOTE — Telephone Encounter (Signed)
Called and pharmacy and they stated that they did get these prescriptions that were sent over.

## 2016-01-09 NOTE — Telephone Encounter (Signed)
Please call into pharmacy

## 2016-01-10 ENCOUNTER — Other Ambulatory Visit: Payer: Self-pay | Admitting: *Deleted

## 2016-01-10 ENCOUNTER — Inpatient Hospital Stay
Admission: RE | Admit: 2016-01-10 | Discharge: 2016-01-10 | Disposition: A | Discharge status: Home / Self Care | Payer: Self-pay | Source: Ambulatory Visit | Attending: *Deleted | Admitting: *Deleted

## 2016-01-10 DIAGNOSIS — Z9289 Personal history of other medical treatment: Secondary | ICD-10-CM

## 2016-02-18 ENCOUNTER — Other Ambulatory Visit: Payer: Self-pay | Admitting: Unknown Physician Specialty

## 2016-02-19 ENCOUNTER — Ambulatory Visit (INDEPENDENT_AMBULATORY_CARE_PROVIDER_SITE_OTHER): Payer: BC Managed Care – PPO | Admitting: Unknown Physician Specialty

## 2016-02-19 ENCOUNTER — Encounter: Payer: Self-pay | Admitting: Unknown Physician Specialty

## 2016-02-19 VITALS — BP 133/84 | HR 62 | Temp 98.3°F | Ht 65.0 in | Wt 178.8 lb

## 2016-02-19 DIAGNOSIS — I10 Essential (primary) hypertension: Secondary | ICD-10-CM | POA: Diagnosis not present

## 2016-02-19 DIAGNOSIS — Z0000 Encounter for general adult medical examination without abnormal findings: Secondary | ICD-10-CM

## 2016-02-19 DIAGNOSIS — E7800 Pure hypercholesterolemia, unspecified: Secondary | ICD-10-CM

## 2016-02-19 DIAGNOSIS — E119 Type 2 diabetes mellitus without complications: Secondary | ICD-10-CM | POA: Diagnosis not present

## 2016-02-19 LAB — BAYER DCA HB A1C WAIVED: HB A1C: 7.1 % — AB (ref <7.0)

## 2016-02-19 MED ORDER — EMPAGLIFLOZIN 25 MG PO TABS: 25.0000 mg | ORAL_TABLET | Freq: Every day | ORAL | 1 refills | Status: DC

## 2016-02-19 NOTE — Progress Notes (Signed)
BP 133/84 (BP Location: Left Arm, Cuff Size: Large)   Pulse 62   Temp 98.3 F (36.8 C)   Ht 5\' 5"  (1.651 m)   Wt 178 lb 12.8 oz (81.1 kg)   LMP  (LMP Unknown)   SpO2 98%   BMI 29.75 kg/m    Subjective:    Patient ID: Kim Hancock, female    DOB: 10/02/1955, 61 y.o.   MRN: [id number]  HPI: Kim GuthrieJean M Behring is a 61 y.o. female  Chief Complaint  Patient presents with  . Annual Exam  . Diabetes    pt states last eye exam in chart is correct    Diabetes: Using medications without difficulties No hypoglycemic episodes No hyperglycemic episodes Feet problems:None Blood Sugars averaging:130 in the AM.  Never over 150 eye exam within last year Last Hgb A1C: 6.8  Hypertension  Using medications without difficulty Average home BPs Not checking   Using medication without problems or lightheadedness No chest pain with exertion or shortness of breath No Edema  Elevated Cholesterol Using medications without problems No Muscle aches  Diet/Exercise: "knee is killing me" and not exercising enough  Family History  Problem Relation Age of Onset  . Stroke Mother   . Alzheimer's disease Mother   . Heart disease Father   . Arthritis Father   . Hyperlipidemia Father   . Thyroid disease Sister   . Thyroid disease Sister   . Thyroid disease Paternal Grandmother   . Thyroid disease Sister   . Breast cancer Paternal Aunt    Past Medical History:  Diagnosis Date  . Diabetes mellitus without complication (HCC)   . Hyperlipidemia   . Hypertension   . Obesity   . Osteoarthritis   . Osteopenia    Past Surgical History:  Procedure Laterality Date  . ABDOMINAL HYSTERECTOMY    . CESAREAN SECTION    . DILATION AND CURETTAGE OF UTERUS    . ECTOPIC PREGNANCY SURGERY    . TOOTH EXTRACTION      Relevant past medical, surgical, family and social history reviewed and updated as indicated. Interim medical history since our last visit reviewed. Allergies and medications reviewed  and updated.  Review of Systems  Constitutional: Negative.   HENT: Negative.   Eyes: Negative.   Cardiovascular: Negative.   Gastrointestinal: Negative.   Endocrine: Negative.   Genitourinary: Negative.   Musculoskeletal: Positive for arthralgias.       Left knee OA with cortosone shots  Skin: Negative.   Allergic/Immunologic: Negative.   Neurological: Negative.   Hematological: Negative.   Psychiatric/Behavioral: Negative.     Per HPI unless specifically indicated above     Objective:    BP 133/84 (BP Location: Left Arm, Cuff Size: Large)   Pulse 62   Temp 98.3 F (36.8 C)   Ht 5\' 5"  (1.651 m)   Wt 178 lb 12.8 oz (81.1 kg)   LMP  (LMP Unknown)   SpO2 98%   BMI 29.75 kg/m   Wt Readings from Last 3 Encounters:  02/19/16 178 lb 12.8 oz (81.1 kg)  11/01/15 179 lb 6.4 oz (81.4 kg)  06/05/15 180 lb 3.2 oz (81.7 kg)    Physical Exam  Constitutional: She is oriented to person, place, and time. She appears well-developed and well-nourished.  HENT:  Head: Normocephalic and atraumatic.  Eyes: Pupils are equal, round, and reactive to light. Right eye exhibits no discharge. Left eye exhibits no discharge. No scleral icterus.  Neck: Normal range of  motion. Neck supple. Carotid bruit is not present. No thyromegaly present.  Cardiovascular: Normal rate, regular rhythm and normal heart sounds.  Exam reveals no gallop and no friction rub.   No murmur heard. Pulmonary/Chest: Effort normal and breath sounds normal. No respiratory distress. She has no wheezes. She has no rales.  Abdominal: Soft. Bowel sounds are normal. There is no tenderness. There is no rebound.  Genitourinary: No breast swelling, tenderness or discharge.  Musculoskeletal: Normal range of motion.  Lymphadenopathy:    She has no cervical adenopathy.  Neurological: She is alert and oriented to person, place, and time.  Skin: Skin is warm, dry and intact. No rash noted.  Psychiatric: She has a normal mood and  affect. Her speech is normal and behavior is normal. Judgment and thought content normal. Cognition and memory are normal.     Assessment & Plan:   Problem List Items Addressed This Visit      Unprioritized   Diabetes mellitus (HCC) - Primary    Hgb A1C is 7.1% She will workon increasing activity.  Recheck in 3 months      Relevant Orders   Bayer DCA Hb A1c Waived   Hypercholesterolemia   Relevant Orders   Lipid Panel w/o Chol/HDL Ratio   Hypertension    Stable, continue present medications.        Relevant Orders   TSH   Comprehensive metabolic panel    Other Visit Diagnoses    Routine general medical examination at a health care facility       Relevant Orders   CBC with Differential/Platelet       Follow up plan: Return in about 3 months (around 05/19/2016).

## 2016-02-19 NOTE — Assessment & Plan Note (Signed)
Stable, continue present medications.   

## 2016-02-19 NOTE — Assessment & Plan Note (Signed)
Hgb A1C is 7.1% She will workon increasing activity.  Recheck in 3 months

## 2016-02-20 LAB — CBC WITH DIFFERENTIAL/PLATELET
Basophils Absolute: 0 10*3/uL (ref 0.0–0.2)
Basos: 1 %
EOS (ABSOLUTE): 0.2 10*3/uL (ref 0.0–0.4)
EOS: 3 %
HEMATOCRIT: 45 % (ref 34.0–46.6)
Hemoglobin: 15.2 g/dL (ref 11.1–15.9)
Immature Grans (Abs): 0 10*3/uL (ref 0.0–0.1)
Immature Granulocytes: 0 %
LYMPHS ABS: 1.5 10*3/uL (ref 0.7–3.1)
Lymphs: 23 %
MCH: 29.6 pg (ref 26.6–33.0)
MCHC: 33.8 g/dL (ref 31.5–35.7)
MCV: 88 fL (ref 79–97)
MONOS ABS: 0.5 10*3/uL (ref 0.1–0.9)
Monocytes: 7 %
Neutrophils Absolute: 4.3 10*3/uL (ref 1.4–7.0)
Neutrophils: 66 %
PLATELETS: 317 10*3/uL (ref 150–379)
RBC: 5.14 x10E6/uL (ref 3.77–5.28)
RDW: 13.3 % (ref 12.3–15.4)
WBC: 6.6 10*3/uL (ref 3.4–10.8)

## 2016-02-20 LAB — COMPREHENSIVE METABOLIC PANEL
ALK PHOS: 57 IU/L (ref 39–117)
ALT: 16 IU/L (ref 0–32)
AST: 14 IU/L (ref 0–40)
Albumin/Globulin Ratio: 2 (ref 1.2–2.2)
Albumin: 4.3 g/dL (ref 3.6–4.8)
BILIRUBIN TOTAL: 0.6 mg/dL (ref 0.0–1.2)
BUN/Creatinine Ratio: 38 — ABNORMAL HIGH (ref 12–28)
BUN: 25 mg/dL (ref 8–27)
CHLORIDE: 99 mmol/L (ref 96–106)
CO2: 29 mmol/L (ref 18–29)
Calcium: 9.5 mg/dL (ref 8.7–10.3)
Creatinine, Ser: 0.66 mg/dL (ref 0.57–1.00)
GFR calc Af Amer: 109 mL/min/{1.73_m2} (ref >59)
GFR calc non Af Amer: 95 mL/min/{1.73_m2} (ref >59)
GLUCOSE: 139 mg/dL — AB (ref 65–99)
Globulin, Total: 2.1 g/dL (ref 1.5–4.5)
Potassium: 4.3 mmol/L (ref 3.5–5.2)
Sodium: 140 mmol/L (ref 134–144)
Total Protein: 6.4 g/dL (ref 6.0–8.5)

## 2016-02-20 LAB — LIPID PANEL W/O CHOL/HDL RATIO
Cholesterol, Total: 166 mg/dL (ref 100–199)
HDL: 50 mg/dL (ref >39)
LDL Calculated: 83 mg/dL (ref 0–99)
Triglycerides: 166 mg/dL — ABNORMAL HIGH (ref 0–149)
VLDL CHOLESTEROL CAL: 33 mg/dL (ref 5–40)

## 2016-02-20 LAB — TSH: TSH: 1.84 u[IU]/mL (ref 0.450–4.500)

## 2016-02-20 NOTE — Progress Notes (Signed)
Normal labs.  Pt notified through mychart

## 2016-05-16 ENCOUNTER — Other Ambulatory Visit: Payer: Self-pay | Admitting: Unknown Physician Specialty

## 2016-05-22 ENCOUNTER — Encounter: Payer: Self-pay | Admitting: Unknown Physician Specialty

## 2016-05-22 ENCOUNTER — Ambulatory Visit (INDEPENDENT_AMBULATORY_CARE_PROVIDER_SITE_OTHER): Payer: BC Managed Care – PPO | Admitting: Unknown Physician Specialty

## 2016-05-22 VITALS — BP 125/82 | HR 65 | Temp 98.4°F | Wt 175.8 lb

## 2016-05-22 DIAGNOSIS — E119 Type 2 diabetes mellitus without complications: Secondary | ICD-10-CM | POA: Diagnosis not present

## 2016-05-22 DIAGNOSIS — I10 Essential (primary) hypertension: Secondary | ICD-10-CM

## 2016-05-22 DIAGNOSIS — E7800 Pure hypercholesterolemia, unspecified: Secondary | ICD-10-CM | POA: Diagnosis not present

## 2016-05-22 NOTE — Assessment & Plan Note (Addendum)
Not to goal.  Hgb A1C is 7.4% She is however getting steroid injections in knee and walking with a cane.  Since it's borderline, we son't change medications for now.  Reasssess after her knee replacement

## 2016-05-22 NOTE — Assessment & Plan Note (Signed)
Stable, continue present medications.   

## 2016-05-22 NOTE — Progress Notes (Signed)
BP 125/82 (BP Location: Left Arm, Patient Position: Sitting, Cuff Size: Large)   Pulse 65   Temp 98.4 F (36.9 C)   Wt 175 lb 12.8 oz (79.7 kg)   LMP  (LMP Unknown)   SpO2 95%   BMI 29.25 kg/m    Subjective:    Patient ID: Kim Hancock, female    DOB: 1955-04-04, 61 y.o.   MRN: [id number]  HPI: Kim Hancock is a 61 y.o. female  Chief Complaint  Patient presents with  . Diabetes    pt states she just got a reminder from eye doctor to schedule f/up with them  . Hyperlipidemia  . Hypertension   Diabetes: Using medications without difficulties No hypoglycemic episodes No hyperglycemic episodes Feet problems: Blood Sugars averaging: 140s is the AM eye exam within last year Last Hgb A1C: 7.1  Hypertension  Using medications without difficulty Average home BPs About what they are here   Using medication without problems or lightheadedness No chest pain with exertion or shortness of breath No Edema  Elevated Cholesterol Using medications without problems No Muscle aches  Diet: Eats a pretty good diet.   Exercise: not getting exercise due to knee OA.  Planning on scheduling for a replacement.    Relevant past medical, surgical, family and social history reviewed and updated as indicated. Interim medical history since our last visit reviewed. Allergies and medications reviewed and updated.  Review of Systems  Per HPI unless specifically indicated above     Objective:    BP 125/82 (BP Location: Left Arm, Patient Position: Sitting, Cuff Size: Large)   Pulse 65   Temp 98.4 F (36.9 C)   Wt 175 lb 12.8 oz (79.7 kg)   LMP  (LMP Unknown)   SpO2 95%   BMI 29.25 kg/m   Wt Readings from Last 3 Encounters:  05/22/16 175 lb 12.8 oz (79.7 kg)  02/19/16 178 lb 12.8 oz (81.1 kg)  11/01/15 179 lb 6.4 oz (81.4 kg)    Physical Exam  Constitutional: She is oriented to person, place, and time. She appears well-developed and well-nourished. No distress.  HENT:  Head:  Normocephalic and atraumatic.  Eyes: Conjunctivae and lids are normal. Right eye exhibits no discharge. Left eye exhibits no discharge. No scleral icterus.  Neck: Normal range of motion. Neck supple. No JVD present. Carotid bruit is not present.  Cardiovascular: Normal rate, regular rhythm and normal heart sounds.   Pulmonary/Chest: Effort normal and breath sounds normal.  Abdominal: Normal appearance. There is no splenomegaly or hepatomegaly.  Musculoskeletal: Normal range of motion.  Neurological: She is alert and oriented to person, place, and time.  Skin: Skin is warm, dry and intact. No rash noted. No pallor.  Psychiatric: She has a normal mood and affect. Her behavior is normal. Judgment and thought content normal.    Results for orders placed or performed in visit on 02/19/16  Lipid Panel w/o Chol/HDL Ratio  Result Value Ref Range   Cholesterol, Total 166 100 - 199 mg/dL   Triglycerides 409 (H) 0 - 149 mg/dL   HDL 50 >81 mg/dL   VLDL Cholesterol Cal 33 5 - 40 mg/dL   LDL Calculated 83 0 - 99 mg/dL  TSH  Result Value Ref Range   TSH 1.840 0.450 - 4.500 uIU/mL  Comprehensive metabolic panel  Result Value Ref Range   Glucose 139 (H) 65 - 99 mg/dL   BUN 25 8 - 27 mg/dL   Creatinine, Ser 1.91  0.57 - 1.00 mg/dL   GFR calc non Af Amer 95 >59 mL/min/1.73   GFR calc Af Amer 109 >59 mL/min/1.73   BUN/Creatinine Ratio 38 (H) 12 - 28   Sodium 140 134 - 144 mmol/L   Potassium 4.3 3.5 - 5.2 mmol/L   Chloride 99 96 - 106 mmol/L   CO2 29 18 - 29 mmol/L   Calcium 9.5 8.7 - 10.3 mg/dL   Total Protein 6.4 6.0 - 8.5 g/dL   Albumin 4.3 3.6 - 4.8 g/dL   Globulin, Total 2.1 1.5 - 4.5 g/dL   Albumin/Globulin Ratio 2.0 1.2 - 2.2   Bilirubin Total 0.6 0.0 - 1.2 mg/dL   Alkaline Phosphatase 57 39 - 117 IU/L   AST 14 0 - 40 IU/L   ALT 16 0 - 32 IU/L  CBC with Differential/Platelet  Result Value Ref Range   WBC 6.6 3.4 - 10.8 x10E3/uL   RBC 5.14 3.77 - 5.28 x10E6/uL   Hemoglobin 15.2 11.1 -  15.9 g/dL   Hematocrit 40.9 81.1 - 46.6 %   MCV 88 79 - 97 fL   MCH 29.6 26.6 - 33.0 pg   MCHC 33.8 31.5 - 35.7 g/dL   RDW 91.4 78.2 - 95.6 %   Platelets 317 150 - 379 x10E3/uL   Neutrophils 66 Not Estab. %   Lymphs 23 Not Estab. %   Monocytes 7 Not Estab. %   Eos 3 Not Estab. %   Basos 1 Not Estab. %   Neutrophils Absolute 4.3 1.4 - 7.0 x10E3/uL   Lymphocytes Absolute 1.5 0.7 - 3.1 x10E3/uL   Monocytes Absolute 0.5 0.1 - 0.9 x10E3/uL   EOS (ABSOLUTE) 0.2 0.0 - 0.4 x10E3/uL   Basophils Absolute 0.0 0.0 - 0.2 x10E3/uL   Immature Granulocytes 0 Not Estab. %   Immature Grans (Abs) 0.0 0.0 - 0.1 x10E3/uL  Bayer DCA Hb A1c Waived  Result Value Ref Range   Bayer DCA Hb A1c Waived 7.1 (H) <7.0 %      Assessment & Plan:   Problem List Items Addressed This Visit      Unprioritized   Diabetes mellitus (HCC) - Primary    Not to goal.  Hgb A1C is 7.4% She is however getting steroid injections in knee and walking with a cane.  Since it's borderline, we son't change medications for now.  Reasssess after her knee replacement      Relevant Orders   Comprehensive metabolic panel   Bayer DCA Hb O1H Waived   Hypercholesterolemia    Stable, continue present medications.        Hypertension    Stable, continue present medications.            Follow up plan: Return in about 4 months (around 09/21/2016).

## 2016-05-23 LAB — COMPREHENSIVE METABOLIC PANEL
A/G RATIO: 1.7 (ref 1.2–2.2)
ALBUMIN: 4.2 g/dL (ref 3.6–4.8)
ALT: 16 IU/L (ref 0–32)
AST: 15 IU/L (ref 0–40)
Alkaline Phosphatase: 51 IU/L (ref 39–117)
BILIRUBIN TOTAL: 0.5 mg/dL (ref 0.0–1.2)
BUN/Creatinine Ratio: 48 — ABNORMAL HIGH (ref 12–28)
BUN: 30 mg/dL — ABNORMAL HIGH (ref 8–27)
CHLORIDE: 99 mmol/L (ref 96–106)
CO2: 26 mmol/L (ref 18–29)
Calcium: 9.8 mg/dL (ref 8.7–10.3)
Creatinine, Ser: 0.62 mg/dL (ref 0.57–1.00)
GFR calc Af Amer: 112 mL/min/{1.73_m2} (ref >59)
GFR, EST NON AFRICAN AMERICAN: 97 mL/min/{1.73_m2} (ref >59)
Globulin, Total: 2.5 g/dL (ref 1.5–4.5)
Glucose: 113 mg/dL — ABNORMAL HIGH (ref 65–99)
POTASSIUM: 3.6 mmol/L (ref 3.5–5.2)
Sodium: 142 mmol/L (ref 134–144)
TOTAL PROTEIN: 6.7 g/dL (ref 6.0–8.5)

## 2016-05-23 LAB — BAYER DCA HB A1C WAIVED: HB A1C (BAYER DCA - WAIVED): 7.4 % — ABNORMAL HIGH (ref <7.0)

## 2016-06-26 ENCOUNTER — Encounter
Admission: RE | Admit: 2016-06-26 | Discharge: 2016-06-26 | Disposition: A | Discharge status: Home / Self Care | Payer: BC Managed Care – PPO | Source: Ambulatory Visit | Attending: Orthopedic Surgery | Admitting: Orthopedic Surgery

## 2016-06-26 DIAGNOSIS — Z01810 Encounter for preprocedural cardiovascular examination: Secondary | ICD-10-CM | POA: Insufficient documentation

## 2016-06-26 DIAGNOSIS — Z01812 Encounter for preprocedural laboratory examination: Secondary | ICD-10-CM | POA: Diagnosis not present

## 2016-06-26 DIAGNOSIS — I10 Essential (primary) hypertension: Secondary | ICD-10-CM | POA: Insufficient documentation

## 2016-06-26 HISTORY — DX: Other allergic rhinitis: J30.89

## 2016-06-26 HISTORY — DX: Hypothyroidism, unspecified: E03.9

## 2016-06-26 LAB — COMPREHENSIVE METABOLIC PANEL
ALBUMIN: 4.1 g/dL (ref 3.5–5.0)
ALT: 21 U/L (ref 14–54)
AST: 33 U/L (ref 15–41)
Alkaline Phosphatase: 51 U/L (ref 38–126)
Anion gap: 9 (ref 5–15)
BUN: 24 mg/dL — ABNORMAL HIGH (ref 6–20)
CHLORIDE: 101 mmol/L (ref 101–111)
CO2: 28 mmol/L (ref 22–32)
Calcium: 9.2 mg/dL (ref 8.9–10.3)
Creatinine, Ser: 0.81 mg/dL (ref 0.44–1.00)
GFR calc Af Amer: >60 mL/min (ref >60)
GFR calc non Af Amer: >60 mL/min (ref >60)
GLUCOSE: 191 mg/dL — AB (ref 65–99)
POTASSIUM: 3.4 mmol/L — AB (ref 3.5–5.1)
Sodium: 138 mmol/L (ref 135–145)
Total Bilirubin: 0.7 mg/dL (ref 0.3–1.2)
Total Protein: 6.9 g/dL (ref 6.5–8.1)

## 2016-06-26 LAB — APTT: APTT: 28 s (ref 24–36)

## 2016-06-26 LAB — URINALYSIS, ROUTINE W REFLEX MICROSCOPIC
Bilirubin Urine: NEGATIVE
Hgb urine dipstick: NEGATIVE
KETONES UR: 5 mg/dL — AB
Leukocytes, UA: NEGATIVE
NITRITE: POSITIVE — AB
PH: 5 (ref 5.0–8.0)
Protein, ur: NEGATIVE mg/dL
SPECIFIC GRAVITY, URINE: 1.026 (ref 1.005–1.030)
Squamous Epithelial / LPF: NONE SEEN

## 2016-06-26 LAB — CBC
HCT: 45.2 % (ref 35.0–47.0)
Hemoglobin: 15.5 g/dL (ref 12.0–16.0)
MCH: 30.2 pg (ref 26.0–34.0)
MCHC: 34.3 g/dL (ref 32.0–36.0)
MCV: 87.9 fL (ref 80.0–100.0)
PLATELETS: 282 10*3/uL (ref 150–440)
RBC: 5.14 MIL/uL (ref 3.80–5.20)
RDW: 12.9 % (ref 11.5–14.5)
WBC: 7.6 10*3/uL (ref 3.6–11.0)

## 2016-06-26 LAB — TYPE AND SCREEN
ABO/RH(D): A POS
ANTIBODY SCREEN: NEGATIVE

## 2016-06-26 LAB — PROTIME-INR
INR: 0.96
PROTHROMBIN TIME: 12.8 s (ref 11.4–15.2)

## 2016-06-26 LAB — SURGICAL PCR SCREEN
MRSA, PCR: NEGATIVE
STAPHYLOCOCCUS AUREUS: POSITIVE — AB

## 2016-06-26 LAB — SEDIMENTATION RATE: Sed Rate: 3 mm/hr (ref 0–30)

## 2016-06-26 LAB — C-REACTIVE PROTEIN: CRP: 1 mg/dL — ABNORMAL HIGH (ref <1.0)

## 2016-06-26 NOTE — Pre-Procedure Instructions (Addendum)
Dr Pernell DupreAdams notified regarding abnormal EKG. Medical clearance needed for abnormal EKG.  [facility] and faxed request for medical clearance.

## 2016-06-26 NOTE — Patient Instructions (Signed)
Your procedure is scheduled on: 07/08/16 Mon Report to Same Day Surgery 2nd floor medical mall [facility] Entrance-take elevator on left to 2nd floor.  Check in with surgery information desk.) To find out your arrival time please call [phone] between 1PM - 3PM on 07/05/16 Fri  Remember: Instructions that are not followed completely may result in serious medical risk, up to and including death, or upon the discretion of your surgeon and anesthesiologist your surgery may need to be rescheduled.    _x___ 1. Do not eat food or drink liquids after midnight. No gum chewing or                              hard candies.     __x__ 2. No Alcohol for 24 hours before or after surgery.   __x__3. No Smoking for 24 prior to surgery.   ____  4. Bring all medications with you on the day of surgery if instructed.    __x__ 5. Notify your doctor if there is any change in your medical condition     (cold, fever, infections).     Do not wear jewelry, make-up, hairpins, clips or nail polish.  Do not wear lotions, powders, or perfumes. You may wear deodorant.  Do not shave 48 hours prior to surgery. Men may shave face and neck.  Do not bring valuables to the hospital.    [facility] is not responsible for any belongings or valuables.               Contacts, dentures or bridgework may not be worn into surgery.  Leave your suitcase in the car. After surgery it may be brought to your room.  For patients admitted to the hospital, discharge time is determined by your                       treatment team.   Patients discharged the day of surgery will not be allowed to drive home.  You will need someone to drive you home and stay with you the night of your procedure.    Please read over the following fact sheets that you were given:   [facility] Preparing for Surgery and or MRSA Information   _x___ Take anti-hypertensive (unless it includes a diuretic), cardiac, seizure, asthma,     anti-reflux and  psychiatric medicines. These include:  1. levothyroxine (SYNTHROID, LEVOTHROID  2.  3.  4.  5.  6.  ____Fleets enema or Magnesium Citrate as directed.   _x___ Use CHG Soap or sage wipes as directed on instruction sheet   ____ Use inhalers on the day of surgery and bring to hospital day of surgery  __x__ Stop Metformin and Janumet 2 days prior to surgery.    ____ Take 1/2 of usual insulin dose the night before surgery and none on the morning     surgery.   _x___ Follow recommendations from Cardiologist, Pulmonologist or PCP regarding          stopping Aspirin, Coumadin, Pllavix ,Eliquis, Effient, or Pradaxa, and Pletal.  X____Stop Anti-inflammatories such as Advil, Aleve, Ibuprofen, Motrin, Naproxen, Naprosyn, Goodies powders or aspirin products.  Stop Aspirin 1 week before surgery. OK to take Tylenol and                          Celebrex.   _x___ Stop supplements until after  surgery.  But may continue Vitamin D, Vitamin B,       and multivitamin.   ____ Bring C-Pap to the hospital.

## 2016-06-27 LAB — HEMOGLOBIN A1C
Hgb A1c MFr Bld: 7.4 % — ABNORMAL HIGH (ref 4.8–5.6)
MEAN PLASMA GLUCOSE: 166 mg/dL

## 2016-06-27 NOTE — Pre-Procedure Instructions (Signed)
ABNORMAL LABS FAXED TO DR HOOTEN/ELAINE

## 2016-06-28 LAB — LATEX, IGE: Latex: <0.1 kU/L

## 2016-06-29 LAB — URINE CULTURE
Culture: >=100000 — AB
Special Requests: NORMAL

## 2016-07-02 ENCOUNTER — Telehealth: Payer: Self-pay

## 2016-07-02 NOTE — Telephone Encounter (Signed)
Surgery clearance scheduled for tomorrow.

## 2016-07-02 NOTE — Telephone Encounter (Signed)
[facility] stating that this patient needs medical clearance for surgery coming up on 07/08/16. Please call and schedule patient surgery clearance before 07/08/16.

## 2016-07-03 ENCOUNTER — Telehealth: Payer: Self-pay | Admitting: Unknown Physician Specialty

## 2016-07-03 ENCOUNTER — Ambulatory Visit (INDEPENDENT_AMBULATORY_CARE_PROVIDER_SITE_OTHER): Payer: BC Managed Care – PPO | Admitting: Unknown Physician Specialty

## 2016-07-03 ENCOUNTER — Encounter: Payer: Self-pay | Admitting: Unknown Physician Specialty

## 2016-07-03 VITALS — BP 149/84 | HR 76 | Temp 98.4°F | Wt 177.2 lb

## 2016-07-03 DIAGNOSIS — R9431 Abnormal electrocardiogram [ECG] [EKG]: Secondary | ICD-10-CM | POA: Diagnosis not present

## 2016-07-03 DIAGNOSIS — Z01818 Encounter for other preprocedural examination: Secondary | ICD-10-CM | POA: Diagnosis not present

## 2016-07-03 NOTE — Progress Notes (Signed)
BP (!) 149/84   Pulse 76   Temp 98.4 F (36.9 C)   Wt 177 lb 3.2 oz (80.4 kg)   LMP  (LMP Unknown)   SpO2 97%   BMI 30.42 kg/m    Subjective:    Patient ID: Kim Hancock, female    DOB: 1955-02-23, 61 y.o.   MRN: [id number]  HPI: Kim Hancock is a 61 y.o. female  Chief Complaint  Patient presents with  . Surgery Clearance    pt states she has knee surgery scheduled for 07/08/16   Pt is here due to an abnormal EKG during pre-op screening for a TKA sheduled in 5 days.  Pt denies chest pain and SOB.  She can climb 2 flights of stairs without SOB.  She is on optimal medication including Crestor 20 mg daily.    Relevant past medical, surgical, family and social history reviewed and updated as indicated. Interim medical history since our last visit reviewed. Allergies and medications reviewed and updated.  Review of Systems  Per HPI unless specifically indicated above     Objective:    BP (!) 149/84   Pulse 76   Temp 98.4 F (36.9 C)   Wt 177 lb 3.2 oz (80.4 kg)   LMP  (LMP Unknown)   SpO2 97%   BMI 30.42 kg/m   Wt Readings from Last 3 Encounters:  07/03/16 177 lb 3.2 oz (80.4 kg)  06/26/16 176 lb (79.8 kg)  05/22/16 175 lb 12.8 oz (79.7 kg)    Physical Exam  Constitutional: She is oriented to person, place, and time. She appears well-developed and well-nourished. No distress.  HENT:  Head: Normocephalic and atraumatic.  Eyes: Conjunctivae and lids are normal. Right eye exhibits no discharge. Left eye exhibits no discharge. No scleral icterus.  Neck: Normal range of motion. Neck supple. No JVD present. Carotid bruit is not present.  Cardiovascular: Normal rate, regular rhythm and normal heart sounds.   Pulmonary/Chest: Effort normal and breath sounds normal.  Abdominal: Normal appearance. There is no splenomegaly or hepatomegaly.  Musculoskeletal: Normal range of motion.  Neurological: She is alert and oriented to person, place, and time.  Skin: Skin is warm,  dry and intact. No rash noted. No pallor.  Psychiatric: She has a normal mood and affect. Her behavior is normal. Judgment and thought content normal.    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  No acute changes.  No old EKGs for comparison purposes.    Results for orders placed or performed during the hospital encounter of 06/26/16  Urine culture  Result Value Ref Range   Specimen Description URINE, RANDOM    Special Requests Normal    Culture >=100,000 COLONIES/mL ESCHERICHIA COLI (A)    Report Status 06/29/2016 FINAL    Organism ID, Bacteria ESCHERICHIA COLI (A)       Susceptibility   Escherichia coli - MIC*    AMPICILLIN 8 SENSITIVE Sensitive     CEFAZOLIN <=4 SENSITIVE Sensitive     CEFTRIAXONE <=1 SENSITIVE Sensitive     CIPROFLOXACIN <=0.25 SENSITIVE Sensitive     GENTAMICIN <=1 SENSITIVE Sensitive     IMIPENEM <=0.25 SENSITIVE Sensitive     NITROFURANTOIN <=16 SENSITIVE Sensitive     TRIMETH/SULFA <=20 SENSITIVE Sensitive     AMPICILLIN/SULBACTAM <=2 SENSITIVE Sensitive     PIP/TAZO <=4 SENSITIVE Sensitive     Extended ESBL NEGATIVE Sensitive     * >=100,000 COLONIES/mL ESCHERICHIA COLI  Surgical pcr screen  Result Value Ref Range   MRSA, PCR NEGATIVE NEGATIVE   Staphylococcus aureus POSITIVE (A) NEGATIVE  APTT  Result Value Ref Range   aPTT 28 24 - 36 seconds  CBC  Result Value Ref Range   WBC 7.6 3.6 - 11.0 K/uL   RBC 5.14 3.80 - 5.20 MIL/uL   Hemoglobin 15.5 12.0 - 16.0 g/dL   HCT 40.945.2 81.135.0 - 91.447.0 %   MCV 87.9 80.0 - 100.0 fL   MCH 30.2 26.0 - 34.0 pg   MCHC 34.3 32.0 - 36.0 g/dL   RDW 78.212.9 95.611.5 - 21.314.5 %   Platelets 282 150 - 440 K/uL  Comprehensive metabolic panel  Result Value Ref Range   Sodium 138 135 - 145 mmol/L   Potassium 3.4 (L) 3.5 - 5.1 mmol/L   Chloride 101 101 - 111 mmol/L   CO2 28 22 - 32 mmol/L   Glucose, Bld 191 (H) 65 - 99 mg/dL   BUN 24 (H) 6 - 20 mg/dL   Creatinine, Ser 0.860.81 0.44 - 1.00 mg/dL   Calcium 9.2 8.9 - 57.810.3 mg/dL    Total Protein 6.9 6.5 - 8.1 g/dL   Albumin 4.1 3.5 - 5.0 g/dL   AST 33 15 - 41 U/L   ALT 21 14 - 54 U/L   Alkaline Phosphatase 51 38 - 126 U/L   Total Bilirubin 0.7 0.3 - 1.2 mg/dL   GFR calc non Af Amer >60 >60 mL/min   GFR calc Af Amer >60 >60 mL/min   Anion gap 9 5 - 15  Protime-INR  Result Value Ref Range   Prothrombin Time 12.8 11.4 - 15.2 seconds   INR 0.96   Urinalysis, Routine w reflex microscopic  Result Value Ref Range   Color, Urine YELLOW (A) YELLOW   APPearance HAZY (A) CLEAR   Specific Gravity, Urine 1.026 1.005 - 1.030   pH 5.0 5.0 - 8.0   Glucose, UA >=500 (A) NEGATIVE mg/dL   Hgb urine dipstick NEGATIVE NEGATIVE   Bilirubin Urine NEGATIVE NEGATIVE   Ketones, ur 5 (A) NEGATIVE mg/dL   Protein, ur NEGATIVE NEGATIVE mg/dL   Nitrite POSITIVE (A) NEGATIVE   Leukocytes, UA NEGATIVE NEGATIVE   RBC / HPF 0-5 0 - 5 RBC/hpf   WBC, UA 0-5 0 - 5 WBC/hpf   Bacteria, UA RARE (A) NONE SEEN   Squamous Epithelial / LPF NONE SEEN NONE SEEN   Mucous PRESENT   C-reactive protein  Result Value Ref Range   CRP 1.0 (H) <1.0 mg/dL  Hemoglobin I6NA1c  Result Value Ref Range   Hgb A1c MFr Bld 7.4 (H) 4.8 - 5.6 %   Mean Plasma Glucose 166 mg/dL  Latex, IgE  Result Value Ref Range   Class Description Comment    Latex <0.10 Class 0 kU/L  Sedimentation rate  Result Value Ref Range   Sed Rate 3 0 - 30 mm/hr  Type and screen Order type and screen if day of surgery is less than 15 days from draw of preadmission visit or order morning of surgery if day of surgery is greater than 6 days from preadmission visit.  Result Value Ref Range   ABO/RH(D) A POS    Antibody Screen NEG    Sample Expiration 07/10/2016    Extend sample reason NO TRANSFUSIONS OR PREGNANCY IN THE PAST 3 MONTHS       Assessment & Plan:   Problem List Items Addressed This Visit      Unprioritized  Abnormal EKG    ? 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  Will refer to cardiology following surgery      Relevant Orders    Ambulatory referral to Cardiology    Other Visit Diagnoses    Pre-operative clearance    -  Primary   Discussed pt with Dr. Dossie Arbour.  There are no acute changes and pt is asymptomatic.  She is on optimal medication managment.  Moderate risk for surgery   Relevant Orders   EKG 12-Lead (Completed)       Follow up plan: Return for routine f/u in 2 months.

## 2016-07-03 NOTE — Telephone Encounter (Signed)
Medical Clearance was received. Patient seen here today and OV note, EKG, and clearance form were faxed back to the number on the fax. Called and left VM with Tiffany at Orthopedics letting her know this information. Asked for her to give me a call with any questions or concerns.

## 2016-07-03 NOTE — Assessment & Plan Note (Signed)
?   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  Will refer to cardiology following surgery

## 2016-07-03 NOTE — Telephone Encounter (Signed)
[facility] in [facility] to a fax they had sent over about medical clarence. They wanted to make sure the fax was received due to not receiving anything back yet.   Please Advise.  Thank you

## 2016-07-04 ENCOUNTER — Telehealth: Payer: Self-pay

## 2016-07-04 NOTE — Telephone Encounter (Signed)
Received new patient referral from pcp cheryl wicker for abnormal ekg   Patient scheduled next available 8/1 Dr. Okey DupreEnd at [phone] added to waitlist    Patient aware that we may call her with a sooner appt but is ok with waiting until scheduled appt.

## 2016-07-04 NOTE — Telephone Encounter (Signed)
Patient is on waiting list for our providers in our office.

## 2016-07-07 MED ORDER — TRANEXAMIC ACID 1000 MG/10ML IV SOLN
1000.0000 mg | INTRAVENOUS | Status: DC
  Filled 2016-07-07: qty 10

## 2016-07-07 MED ORDER — CLINDAMYCIN PHOSPHATE 900 MG/50ML IV SOLN
900.0000 mg | INTRAVENOUS | Status: AC
Start: 2016-07-08 — End: 2016-07-08
  Administered 2016-07-08: 900 mg via INTRAVENOUS

## 2016-07-08 ENCOUNTER — Encounter
Admission: RE | Disposition: A | Discharge status: Home Health | Payer: Self-pay | Source: Ambulatory Visit | Attending: Orthopedic Surgery

## 2016-07-08 ENCOUNTER — Encounter: Payer: Self-pay | Admitting: Orthopedic Surgery

## 2016-07-08 ENCOUNTER — Ambulatory Visit: Payer: BC Managed Care – PPO | Admitting: Anesthesiology

## 2016-07-08 ENCOUNTER — Inpatient Hospital Stay: Payer: BC Managed Care – PPO

## 2016-07-08 ENCOUNTER — Inpatient Hospital Stay
Admission: RE | Admit: 2016-07-08 | Discharge: 2016-07-10 | DRG: 470 | Disposition: A | Discharge status: Home Health | Payer: BC Managed Care – PPO | Source: Ambulatory Visit | Attending: Orthopedic Surgery | Admitting: Orthopedic Surgery

## 2016-07-08 DIAGNOSIS — Z6830 Body mass index (BMI) 30.0-30.9, adult: Secondary | ICD-10-CM | POA: Diagnosis not present

## 2016-07-08 DIAGNOSIS — I10 Essential (primary) hypertension: Secondary | ICD-10-CM | POA: Diagnosis present

## 2016-07-08 DIAGNOSIS — E039 Hypothyroidism, unspecified: Secondary | ICD-10-CM | POA: Diagnosis present

## 2016-07-08 DIAGNOSIS — I252 Old myocardial infarction: Secondary | ICD-10-CM

## 2016-07-08 DIAGNOSIS — M8580 Other specified disorders of bone density and structure, unspecified site: Secondary | ICD-10-CM | POA: Diagnosis present

## 2016-07-08 DIAGNOSIS — M1712 Unilateral primary osteoarthritis, left knee: Secondary | ICD-10-CM | POA: Diagnosis present

## 2016-07-08 DIAGNOSIS — Z7984 Long term (current) use of oral hypoglycemic drugs: Secondary | ICD-10-CM | POA: Diagnosis not present

## 2016-07-08 DIAGNOSIS — E669 Obesity, unspecified: Secondary | ICD-10-CM | POA: Diagnosis present

## 2016-07-08 DIAGNOSIS — Z96659 Presence of unspecified artificial knee joint: Secondary | ICD-10-CM

## 2016-07-08 DIAGNOSIS — E119 Type 2 diabetes mellitus without complications: Secondary | ICD-10-CM | POA: Diagnosis present

## 2016-07-08 DIAGNOSIS — E785 Hyperlipidemia, unspecified: Secondary | ICD-10-CM | POA: Diagnosis present

## 2016-07-08 HISTORY — DX: Acute myocardial infarction, unspecified: I21.9

## 2016-07-08 HISTORY — PX: KNEE ARTHROPLASTY: SHX992

## 2016-07-08 LAB — GLUCOSE, CAPILLARY
GLUCOSE-CAPILLARY: 148 mg/dL — AB (ref 65–99)
GLUCOSE-CAPILLARY: 166 mg/dL — AB (ref 65–99)
Glucose-Capillary: 115 mg/dL — ABNORMAL HIGH (ref 65–99)

## 2016-07-08 LAB — ABO/RH: ABO/RH(D): A POS

## 2016-07-08 SURGERY — ARTHROPLASTY, KNEE, TOTAL, USING IMAGELESS COMPUTER-ASSISTED NAVIGATION
Anesthesia: Spinal | Site: Knee | Laterality: Left | Wound class: Clean

## 2016-07-08 MED ORDER — OXYCODONE HCL 5 MG/5ML PO SOLN: 5.0000 mg | Freq: Once | ORAL | Status: DC | PRN

## 2016-07-08 MED ORDER — ONDANSETRON HCL 4 MG PO TABS: 4.0000 mg | ORAL_TABLET | Freq: Four times a day (QID) | ORAL | Status: DC | PRN

## 2016-07-08 MED ORDER — CANAGLIFLOZIN 100 MG PO TABS: 100.0000 mg | ORAL_TABLET | Freq: Every day | ORAL | Status: DC

## 2016-07-08 MED ORDER — MORPHINE SULFATE (PF) 2 MG/ML IV SOLN: 2.0000 mg | INTRAVENOUS | Status: DC | PRN

## 2016-07-08 MED ORDER — ENOXAPARIN SODIUM 30 MG/0.3ML ~~LOC~~ SOLN
30.0000 mg | Freq: Two times a day (BID) | SUBCUTANEOUS | Status: DC
  Administered 2016-07-09 – 2016-07-10 (×4): 30 mg via SUBCUTANEOUS
  Filled 2016-07-08 (×3): qty 0.3

## 2016-07-08 MED ORDER — PROPOFOL 500 MG/50ML IV EMUL
INTRAVENOUS | Status: AC
  Filled 2016-07-08: qty 50

## 2016-07-08 MED ORDER — PHENYLEPHRINE HCL 10 MG/ML IJ SOLN
INTRAMUSCULAR | Status: DC | PRN
  Administered 2016-07-08: 20 ug/min via INTRAVENOUS

## 2016-07-08 MED ORDER — MIDAZOLAM HCL 2 MG/2ML IJ SOLN
INTRAMUSCULAR | Status: AC
  Filled 2016-07-08: qty 2

## 2016-07-08 MED ORDER — METOCLOPRAMIDE HCL 10 MG PO TABS
10.0000 mg | ORAL_TABLET | Freq: Three times a day (TID) | ORAL | Status: DC
  Administered 2016-07-08 – 2016-07-10 (×3): 10 mg via ORAL
  Filled 2016-07-08 (×6): qty 1

## 2016-07-08 MED ORDER — ACETAMINOPHEN 10 MG/ML IV SOLN
INTRAVENOUS | Status: AC
  Filled 2016-07-08: qty 100

## 2016-07-08 MED ORDER — INSULIN ASPART 100 UNIT/ML ~~LOC~~ SOLN
0.0000 [IU] | Freq: Three times a day (TID) | SUBCUTANEOUS | Status: DC
  Administered 2016-07-08 – 2016-07-09 (×3): 2 [IU] via SUBCUTANEOUS
  Administered 2016-07-09: 3 [IU] via SUBCUTANEOUS
  Administered 2016-07-10: 2 [IU] via SUBCUTANEOUS
  Filled 2016-07-08: qty 2
  Filled 2016-07-08: qty 3
  Filled 2016-07-08 (×3): qty 2

## 2016-07-08 MED ORDER — LORATADINE 10 MG PO TABS
10.0000 mg | ORAL_TABLET | Freq: Every day | ORAL | Status: DC
  Administered 2016-07-09 – 2016-07-10 (×2): 10 mg via ORAL
  Filled 2016-07-08 (×2): qty 1

## 2016-07-08 MED ORDER — CLINDAMYCIN PHOSPHATE 900 MG/50ML IV SOLN
INTRAVENOUS | Status: AC
  Filled 2016-07-08: qty 50

## 2016-07-08 MED ORDER — LOSARTAN POTASSIUM-HCTZ 100-12.5 MG PO TABS: 1.0000 | ORAL_TABLET | Freq: Every day | ORAL | Status: DC

## 2016-07-08 MED ORDER — OXYCODONE HCL 5 MG PO TABS: 5.0000 mg | ORAL_TABLET | Freq: Once | ORAL | Status: DC | PRN

## 2016-07-08 MED ORDER — MIDAZOLAM HCL 5 MG/5ML IJ SOLN
INTRAMUSCULAR | Status: DC | PRN
  Administered 2016-07-08: 2 mg via INTRAVENOUS

## 2016-07-08 MED ORDER — NEOMYCIN-POLYMYXIN B GU 40-200000 IR SOLN
Status: AC
  Filled 2016-07-08: qty 20

## 2016-07-08 MED ORDER — GLYCOPYRROLATE 0.2 MG/ML IJ SOLN
INTRAMUSCULAR | Status: DC | PRN
  Administered 2016-07-08: 0.2 mg via INTRAVENOUS

## 2016-07-08 MED ORDER — ACETAMINOPHEN 325 MG PO TABS
650.0000 mg | ORAL_TABLET | Freq: Four times a day (QID) | ORAL | Status: DC | PRN
  Administered 2016-07-10 (×2): 650 mg via ORAL
  Filled 2016-07-08 (×2): qty 2

## 2016-07-08 MED ORDER — METFORMIN HCL 500 MG PO TABS
500.0000 mg | ORAL_TABLET | Freq: Two times a day (BID) | ORAL | Status: DC
  Administered 2016-07-08 – 2016-07-10 (×4): 500 mg via ORAL
  Filled 2016-07-08 (×4): qty 1

## 2016-07-08 MED ORDER — ACETAMINOPHEN 650 MG RE SUPP: 650.0000 mg | Freq: Four times a day (QID) | RECTAL | Status: DC | PRN

## 2016-07-08 MED ORDER — FENTANYL CITRATE (PF) 100 MCG/2ML IJ SOLN: 25.0000 ug | INTRAMUSCULAR | Status: DC | PRN

## 2016-07-08 MED ORDER — SODIUM CHLORIDE 0.9 % IV SOLN
INTRAVENOUS | Status: DC | PRN
  Administered 2016-07-08: 8 ug/kg/min via INTRAVENOUS

## 2016-07-08 MED ORDER — PHENOL 1.4 % MT LIQD: 1.0000 | OROMUCOSAL | Status: DC | PRN

## 2016-07-08 MED ORDER — PROPOFOL 10 MG/ML IV BOLUS
INTRAVENOUS | Status: DC | PRN
  Administered 2016-07-08 (×3): 20 mg via INTRAVENOUS

## 2016-07-08 MED ORDER — ADULT MULTIVITAMIN W/MINERALS CH
1.0000 | ORAL_TABLET | Freq: Every day | ORAL | Status: DC
  Administered 2016-07-09: 1 via ORAL
  Filled 2016-07-08 (×2): qty 1

## 2016-07-08 MED ORDER — TETRACAINE HCL 1 % IJ SOLN
INTRAMUSCULAR | Status: DC | PRN
  Administered 2016-07-08: 10 mg via INTRASPINAL

## 2016-07-08 MED ORDER — TETRACAINE HCL 1 % IJ SOLN
INTRAMUSCULAR | Status: AC
  Filled 2016-07-08: qty 2

## 2016-07-08 MED ORDER — TRAMADOL HCL 50 MG PO TABS
50.0000 mg | ORAL_TABLET | ORAL | Status: DC | PRN
  Administered 2016-07-08: 100 mg via ORAL
  Filled 2016-07-08: qty 2

## 2016-07-08 MED ORDER — SODIUM CHLORIDE 0.9 % IV SOLN
INTRAVENOUS | Status: DC | PRN
  Administered 2016-07-08: 60 mL

## 2016-07-08 MED ORDER — FAMOTIDINE 20 MG PO TABS: 20.0000 mg | ORAL_TABLET | Freq: Once | ORAL | Status: DC

## 2016-07-08 MED ORDER — CELECOXIB 200 MG PO CAPS
200.0000 mg | ORAL_CAPSULE | Freq: Two times a day (BID) | ORAL | Status: DC
  Administered 2016-07-08 – 2016-07-10 (×4): 200 mg via ORAL
  Filled 2016-07-08 (×4): qty 1

## 2016-07-08 MED ORDER — ONDANSETRON HCL 4 MG/2ML IJ SOLN: 4.0000 mg | Freq: Four times a day (QID) | INTRAMUSCULAR | Status: DC | PRN

## 2016-07-08 MED ORDER — LEVOTHYROXINE SODIUM 100 MCG PO TABS
100.0000 ug | ORAL_TABLET | Freq: Every day | ORAL | Status: DC
  Administered 2016-07-09 – 2016-07-10 (×2): 100 ug via ORAL
  Filled 2016-07-08 (×2): qty 1

## 2016-07-08 MED ORDER — TRANEXAMIC ACID 1000 MG/10ML IV SOLN
INTRAVENOUS | Status: DC | PRN
  Administered 2016-07-08: 1000 mg via INTRAVENOUS

## 2016-07-08 MED ORDER — SENNOSIDES-DOCUSATE SODIUM 8.6-50 MG PO TABS
1.0000 | ORAL_TABLET | Freq: Two times a day (BID) | ORAL | Status: DC
  Administered 2016-07-08 – 2016-07-09 (×3): 1 via ORAL
  Filled 2016-07-08 (×4): qty 1

## 2016-07-08 MED ORDER — CLINDAMYCIN PHOSPHATE 600 MG/50ML IV SOLN
600.0000 mg | Freq: Four times a day (QID) | INTRAVENOUS | Status: AC
  Administered 2016-07-08 – 2016-07-09 (×4): 600 mg via INTRAVENOUS
  Filled 2016-07-08 (×4): qty 50

## 2016-07-08 MED ORDER — NEOMYCIN-POLYMYXIN B GU 40-200000 IR SOLN
Status: DC | PRN
  Administered 2016-07-08: 14 mL

## 2016-07-08 MED ORDER — SODIUM CHLORIDE 0.9 % IJ SOLN
INTRAMUSCULAR | Status: AC
  Filled 2016-07-08: qty 50

## 2016-07-08 MED ORDER — SODIUM CHLORIDE 0.9 % IV SOLN
INTRAVENOUS | Status: DC
  Administered 2016-07-08 – 2016-07-09 (×2): via INTRAVENOUS

## 2016-07-08 MED ORDER — DEXAMETHASONE SODIUM PHOSPHATE 4 MG/ML IJ SOLN
INTRAMUSCULAR | Status: DC | PRN
  Administered 2016-07-08: 5 mg via INTRAVENOUS

## 2016-07-08 MED ORDER — ALUM & MAG HYDROXIDE-SIMETH 200-200-20 MG/5ML PO SUSP: 30.0000 mL | ORAL | Status: DC | PRN

## 2016-07-08 MED ORDER — CALCIUM-VITAMIN D-VITAMIN K 750-500-40 MG-UNT-MCG PO TABS: 1.0000 | ORAL_TABLET | Freq: Every day | ORAL | Status: DC

## 2016-07-08 MED ORDER — VASOPRESSIN 20 UNIT/ML IV SOLN
INTRAVENOUS | Status: DC | PRN
  Administered 2016-07-08: 2 [IU] via INTRAVENOUS

## 2016-07-08 MED ORDER — B COMPLEX PO TABS: 1.0000 | ORAL_TABLET | Freq: Every day | ORAL | Status: DC

## 2016-07-08 MED ORDER — MEPERIDINE HCL 50 MG/ML IJ SOLN: 6.2500 mg | INTRAMUSCULAR | Status: DC | PRN

## 2016-07-08 MED ORDER — BUPIVACAINE HCL (PF) 0.25 % IJ SOLN
INTRAMUSCULAR | Status: DC | PRN
  Administered 2016-07-08: 30 mL

## 2016-07-08 MED ORDER — VITAMIN B-1 100 MG PO TABS
100.0000 mg | ORAL_TABLET | Freq: Every day | ORAL | Status: DC
  Administered 2016-07-09 – 2016-07-10 (×2): 100 mg via ORAL
  Filled 2016-07-08 (×2): qty 1

## 2016-07-08 MED ORDER — ONDANSETRON HCL 4 MG/2ML IJ SOLN
INTRAMUSCULAR | Status: DC | PRN
  Administered 2016-07-08: 4 mg via INTRAVENOUS

## 2016-07-08 MED ORDER — ACETAMINOPHEN 10 MG/ML IV SOLN
1000.0000 mg | Freq: Four times a day (QID) | INTRAVENOUS | Status: AC
  Administered 2016-07-08 – 2016-07-09 (×4): 1000 mg via INTRAVENOUS
  Filled 2016-07-08 (×4): qty 100

## 2016-07-08 MED ORDER — FLEET ENEMA 7-19 GM/118ML RE ENEM: 1.0000 | ENEMA | Freq: Once | RECTAL | Status: DC | PRN

## 2016-07-08 MED ORDER — BUPIVACAINE HCL (PF) 0.25 % IJ SOLN
INTRAMUSCULAR | Status: AC
  Filled 2016-07-08: qty 60

## 2016-07-08 MED ORDER — MAGNESIUM HYDROXIDE 400 MG/5ML PO SUSP
30.0000 mL | Freq: Every day | ORAL | Status: DC | PRN
  Administered 2016-07-09 – 2016-07-10 (×2): 30 mL via ORAL
  Filled 2016-07-08 (×2): qty 30

## 2016-07-08 MED ORDER — SODIUM CHLORIDE 0.9 % IV SOLN
INTRAVENOUS | Status: DC
  Administered 2016-07-08 (×2): via INTRAVENOUS

## 2016-07-08 MED ORDER — DIPHENHYDRAMINE HCL 12.5 MG/5ML PO ELIX
12.5000 mg | ORAL_SOLUTION | ORAL | Status: DC | PRN
Start: 2016-07-08 — End: 2016-07-10

## 2016-07-08 MED ORDER — FERROUS SULFATE 325 (65 FE) MG PO TABS
325.0000 mg | ORAL_TABLET | Freq: Two times a day (BID) | ORAL | Status: DC
  Administered 2016-07-09 – 2016-07-10 (×3): 325 mg via ORAL
  Filled 2016-07-08 (×3): qty 1

## 2016-07-08 MED ORDER — OXYCODONE HCL 5 MG PO TABS
5.0000 mg | ORAL_TABLET | ORAL | Status: DC | PRN
  Administered 2016-07-08 (×2): 5 mg via ORAL
  Administered 2016-07-09: 10 mg via ORAL
  Filled 2016-07-08 (×2): qty 1
  Filled 2016-07-08: qty 2

## 2016-07-08 MED ORDER — BUPIVACAINE LIPOSOME 1.3 % IJ SUSP
INTRAMUSCULAR | Status: AC
  Filled 2016-07-08: qty 20

## 2016-07-08 MED ORDER — TRANEXAMIC ACID 1000 MG/10ML IV SOLN
1000.0000 mg | Freq: Once | INTRAVENOUS | Status: AC
  Administered 2016-07-08: 1000 mg via INTRAVENOUS
  Filled 2016-07-08: qty 10

## 2016-07-08 MED ORDER — PANTOPRAZOLE SODIUM 40 MG PO TBEC
40.0000 mg | DELAYED_RELEASE_TABLET | Freq: Two times a day (BID) | ORAL | Status: DC
  Administered 2016-07-08 – 2016-07-10 (×4): 40 mg via ORAL
  Filled 2016-07-08 (×4): qty 1

## 2016-07-08 MED ORDER — ROSUVASTATIN CALCIUM 20 MG PO TABS
20.0000 mg | ORAL_TABLET | Freq: Every day | ORAL | Status: DC
  Administered 2016-07-08 – 2016-07-10 (×3): 20 mg via ORAL
  Filled 2016-07-08 (×3): qty 1

## 2016-07-08 MED ORDER — BISACODYL 10 MG RE SUPP: 10.0000 mg | Freq: Every day | RECTAL | Status: DC | PRN

## 2016-07-08 MED ORDER — FENTANYL CITRATE (PF) 100 MCG/2ML IJ SOLN
INTRAMUSCULAR | Status: DC | PRN
  Administered 2016-07-08: 50 ug via INTRAVENOUS
  Administered 2016-07-08 (×2): 25 ug via INTRAVENOUS

## 2016-07-08 MED ORDER — FENTANYL CITRATE (PF) 100 MCG/2ML IJ SOLN
INTRAMUSCULAR | Status: AC
  Filled 2016-07-08: qty 2

## 2016-07-08 MED ORDER — PROMETHAZINE HCL 25 MG/ML IJ SOLN: 6.2500 mg | INTRAMUSCULAR | Status: DC | PRN

## 2016-07-08 MED ORDER — ONDANSETRON HCL 4 MG/2ML IJ SOLN
INTRAMUSCULAR | Status: AC
Start: 2016-07-08 — End: 2016-07-08
  Filled 2016-07-08: qty 2

## 2016-07-08 MED ORDER — OMEGA-3-ACID ETHYL ESTERS 1 G PO CAPS
1.0000 g | ORAL_CAPSULE | Freq: Two times a day (BID) | ORAL | Status: DC
  Administered 2016-07-08 – 2016-07-10 (×4): 1 g via ORAL
  Filled 2016-07-08 (×4): qty 1

## 2016-07-08 MED ORDER — LOSARTAN POTASSIUM 50 MG PO TABS
100.0000 mg | ORAL_TABLET | Freq: Every day | ORAL | Status: DC
  Administered 2016-07-10: 100 mg via ORAL
  Filled 2016-07-08: qty 2

## 2016-07-08 MED ORDER — LIDOCAINE HCL (PF) 2 % IJ SOLN
INTRAMUSCULAR | Status: AC
  Filled 2016-07-08: qty 2

## 2016-07-08 MED ORDER — ACETAMINOPHEN 10 MG/ML IV SOLN
INTRAVENOUS | Status: DC | PRN
  Administered 2016-07-08: 1000 mg via INTRAVENOUS

## 2016-07-08 MED ORDER — KETAMINE HCL 50 MG/ML IJ SOLN
INTRAMUSCULAR | Status: DC | PRN
  Administered 2016-07-08 (×2): 2 mg via INTRAMUSCULAR

## 2016-07-08 MED ORDER — CHLORHEXIDINE GLUCONATE 4 % EX LIQD: 60.0000 mL | Freq: Once | CUTANEOUS | Status: DC

## 2016-07-08 MED ORDER — PROPOFOL 500 MG/50ML IV EMUL
INTRAVENOUS | Status: DC | PRN
  Administered 2016-07-08: 80 ug/kg/min via INTRAVENOUS

## 2016-07-08 MED ORDER — BUPIVACAINE HCL (PF) 0.5 % IJ SOLN
INTRAMUSCULAR | Status: DC | PRN
  Administered 2016-07-08: 2 mL

## 2016-07-08 MED ORDER — HYDROCHLOROTHIAZIDE 12.5 MG PO CAPS
12.5000 mg | ORAL_CAPSULE | Freq: Every day | ORAL | Status: DC
  Administered 2016-07-10: 12.5 mg via ORAL
  Filled 2016-07-08: qty 1

## 2016-07-08 MED ORDER — MENTHOL 3 MG MT LOZG: 1.0000 | LOZENGE | OROMUCOSAL | Status: DC | PRN

## 2016-07-08 SURGICAL SUPPLY — 66 items
BATTERY INSTRU NAVIGATION (MISCELLANEOUS) ×12 IMPLANT
BLADE SAW 1 (BLADE) ×3 IMPLANT
BLADE SAW 1/2 (BLADE) ×3 IMPLANT
BLADE SAW 70X12.5 (BLADE) IMPLANT
BONE CEMENT GENTAMICIN (Cement) ×6 IMPLANT
CANISTER SUCT 1200ML W/VALVE (MISCELLANEOUS) ×3 IMPLANT
CANISTER SUCT 3000ML PPV (MISCELLANEOUS) ×6 IMPLANT
CAPT KNEE TOTAL 3 ATTUNE ×3 IMPLANT
CATH TRAY METER 16FR LF (MISCELLANEOUS) ×3 IMPLANT
CEMENT BONE GENTAMICIN 40 (Cement) ×2 IMPLANT
COOLER POLAR GLACIER W/PUMP (MISCELLANEOUS) ×3 IMPLANT
CUFF TOURN 24 STER (MISCELLANEOUS) IMPLANT
CUFF TOURN 30 STER DUAL PORT (MISCELLANEOUS) ×3 IMPLANT
DECANTER SPIKE VIAL GLASS SM (MISCELLANEOUS) ×3 IMPLANT
DRAPE SHEET LG 3/4 BI-LAMINATE (DRAPES) ×3 IMPLANT
DRSG DERMACEA 8X12 NADH (GAUZE/BANDAGES/DRESSINGS) ×3 IMPLANT
DRSG OPSITE POSTOP 4X14 (GAUZE/BANDAGES/DRESSINGS) ×3 IMPLANT
DRSG TEGADERM 4X4.75 (GAUZE/BANDAGES/DRESSINGS) ×3 IMPLANT
DURAPREP 26ML APPLICATOR (WOUND CARE) ×6 IMPLANT
ELECT CAUTERY BLADE 6.4 (BLADE) ×3 IMPLANT
ELECT REM PT RETURN 9FT ADLT (ELECTROSURGICAL) ×3
ELECTRODE REM PT RTRN 9FT ADLT (ELECTROSURGICAL) ×1 IMPLANT
EX-PIN ORTHOLOCK NAV 4X150 (PIN) ×6 IMPLANT
GLOVE BIOGEL M STRL SZ7.5 (GLOVE) ×9 IMPLANT
GLOVE BIOGEL PI IND STRL 7.0 (GLOVE) ×2 IMPLANT
GLOVE BIOGEL PI IND STRL 9 (GLOVE) ×2 IMPLANT
GLOVE BIOGEL PI INDICATOR 7.0 (GLOVE) ×4
GLOVE BIOGEL PI INDICATOR 9 (GLOVE) ×4
GLOVE INDICATOR 8.0 STRL GRN (GLOVE) ×9 IMPLANT
GLOVE SURG SYN 9.0  PF PI (GLOVE) ×4
GLOVE SURG SYN 9.0 PF PI (GLOVE) ×2 IMPLANT
GOWN STRL REUS W/ TWL LRG LVL3 (GOWN DISPOSABLE) ×3 IMPLANT
GOWN STRL REUS W/TWL 2XL LVL3 (GOWN DISPOSABLE) ×3 IMPLANT
GOWN STRL REUS W/TWL LRG LVL3 (GOWN DISPOSABLE) ×6
HEMOVAC 400CC 10FR (MISCELLANEOUS) ×3 IMPLANT
HOLDER FOLEY CATH W/STRAP (MISCELLANEOUS) ×3 IMPLANT
HOOD PEEL AWAY FLYTE STAYCOOL (MISCELLANEOUS) ×9 IMPLANT
KIT RM TURNOVER STRD PROC AR (KITS) ×3 IMPLANT
KNIFE SCULPS 14X20 (INSTRUMENTS) ×3 IMPLANT
LABEL OR SOLS (LABEL) ×3 IMPLANT
NDL SAFETY 18GX1.5 (NEEDLE) ×3 IMPLANT
NEEDLE SPNL 20GX3.5 QUINCKE YW (NEEDLE) ×3 IMPLANT
NS IRRIG 500ML POUR BTL (IV SOLUTION) ×3 IMPLANT
PACK TOTAL KNEE (MISCELLANEOUS) ×3 IMPLANT
PAD WRAPON POLAR KNEE (MISCELLANEOUS) ×1 IMPLANT
PIN DRILL QUICK PACK ×3 IMPLANT
PIN FIXATION 1/8DIA X 3INL (PIN) ×3 IMPLANT
PULSAVAC PLUS IRRIG FAN TIP (DISPOSABLE) ×3
SOL .9 NS 3000ML IRR  AL (IV SOLUTION) ×2
SOL .9 NS 3000ML IRR UROMATIC (IV SOLUTION) ×1 IMPLANT
SOL PREP PVP 2OZ (MISCELLANEOUS) ×3
SOLUTION PREP PVP 2OZ (MISCELLANEOUS) ×1 IMPLANT
SPONGE DRAIN TRACH 4X4 STRL 2S (GAUZE/BANDAGES/DRESSINGS) ×3 IMPLANT
STAPLER SKIN PROX 35W (STAPLE) ×3 IMPLANT
STRAP TIBIA SHORT (MISCELLANEOUS) ×3 IMPLANT
SUCTION FRAZIER HANDLE 10FR (MISCELLANEOUS) ×2
SUCTION TUBE FRAZIER 10FR DISP (MISCELLANEOUS) ×1 IMPLANT
SUT VIC AB 0 CT1 36 (SUTURE) ×3 IMPLANT
SUT VIC AB 1 CT1 36 (SUTURE) ×6 IMPLANT
SUT VIC AB 2-0 CT2 27 (SUTURE) ×3 IMPLANT
SYR 20CC LL (SYRINGE) ×3 IMPLANT
SYR 30ML LL (SYRINGE) ×6 IMPLANT
TIP FAN IRRIG PULSAVAC PLUS (DISPOSABLE) ×1 IMPLANT
TOWEL OR 17X26 4PK STRL BLUE (TOWEL DISPOSABLE) ×3 IMPLANT
TOWER CARTRIDGE SMART MIX (DISPOSABLE) ×3 IMPLANT
WRAPON POLAR PAD KNEE (MISCELLANEOUS) ×3

## 2016-07-08 NOTE — Progress Notes (Signed)
MEDICATION RELATED NOTE  Pharmacy discontinued Invokana. This is [facility]ul[facility]. Please continue medication at discharge.  Kim Hancock K 07/08/2016,4:00 PM

## 2016-07-08 NOTE — Anesthesia Post-op Follow-up Note (Cosign Needed)
Anesthesia QCDR form completed.        

## 2016-07-08 NOTE — H&P (Signed)
The patient has been re-examined, and the chart reviewed, and there have been no interval changes to the documented history and physical.    The risks, benefits, and alternatives have been discussed at length. The patient expressed understanding of the risks benefits and agreed with plans for surgical intervention.  Telsa Dillavou P. Hans Rusher, Jr. M.D.    

## 2016-07-08 NOTE — Anesthesia Preprocedure Evaluation (Signed)
Anesthesia Evaluation  Patient identified by MRN, date of birth, ID band Patient awake    Reviewed: Allergy & Precautions, NPO status , Patient's Chart, lab work & pertinent test results  History of Anesthesia Complications Negative for: history of anesthetic complications  Airway Mallampati: II  TM Distance: >3 FB Neck ROM: Full    Dental  (+) Implants   Pulmonary neg pulmonary ROS, neg sleep apnea, neg COPD,    breath sounds clear to auscultation- rhonchi (-) wheezing      Cardiovascular Exercise Tolerance: Good hypertension, Pt. 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)  (-) Cardiac Stents and (-) CABG  Rhythm:Regular Rate:Normal - Systolic murmurs and - Diastolic murmurs    Neuro/Psych negative neurological ROS  negative psych ROS   GI/Hepatic negative GI ROS, Neg liver ROS,   Endo/Other  diabetes, Oral Hypoglycemic AgentsHypothyroidism   Renal/GU negative Renal ROS     Musculoskeletal  (+) Arthritis ,   Abdominal (+) + obese,   Peds  Hematology negative hematology ROS (+)   Anesthesia Other Findings Past Medical History: No date: Diabetes mellitus without complication (HCC) No date: Environmental and seasonal allergies No date: Hyperlipidemia No date: Hypertension No date: Hypothyroidism No date: Myocardial infarction (HCC) No date: Obesity No date: Osteoarthritis No date: Osteopenia   Reproductive/Obstetrics                             Anesthesia Physical Anesthesia Plan  ASA: III  Anesthesia Plan: Spinal   Post-op Pain Management:    Induction:   Airway Management Planned: Natural Airway  Additional Equipment:   Intra-op Plan:   Post-operative Plan:   Informed Consent: I have reviewed the patients History and Physical, chart, labs and discussed the procedure including the risks, benefits and alternatives for the proposed anesthesia with  the patient or authorized representative who has indicated his/her understanding and acceptance.   Dental advisory given  Plan Discussed with: Anesthesiologist and CRNA  Anesthesia Plan Comments:         Lab Results  Component Value Date   WBC 7.6 06/26/2016   HGB 15.5 06/26/2016   HCT 45.2 06/26/2016   MCV 87.9 06/26/2016   PLT 282 06/26/2016    Anesthesia Quick Evaluation

## 2016-07-08 NOTE — Transfer of Care (Signed)
Immediate Anesthesia Transfer of Care Note  Patient: Kim Hancock  Procedure(s) Performed: Procedure(s): COMPUTER ASSISTED TOTAL KNEE ARTHROPLASTY (Left)  Patient Location: PACU  Anesthesia Type:Spinal  Level of Consciousness: awake, alert , oriented and patient cooperative  Airway & Oxygen Therapy: Patient Spontanous Breathing and Patient connected to nasal cannula oxygen  Post-op Assessment: Report given to RN and Post -op Vital signs reviewed and stable  Post vital signs: Reviewed and stable  Last Vitals:  Vitals:   07/08/16 1023 07/08/16 1412  BP: (!) 159/85   Pulse: 78 (P) 69  Resp: 18 (P) 10  Temp: 36.9 C (P) 36.2 C    Last Pain:  Vitals:   07/08/16 1023  TempSrc: Oral  PainSc: 5          Complications: No apparent anesthesia complications

## 2016-07-08 NOTE — NC FL2 (Signed)
[place] MEDICAID FL2 LEVEL OF CARE SCREENING TOOL     IDENTIFICATION  Patient Name: Kim Hancock Birthdate: [birth year] Sex: female Admission Date (Current Location): 07/08/2016  [place] and [place] Number:  Chiropodist and Address:  [facility], [address] [place], [place] [address]      Provider Number: [id number]  Attending Physician Name and Address:  Donato Heinz, MD  Relative Name and Phone Number:       Current Level of Care: Hospital Recommended Level of Care: Skilled Nursing Facility Prior Approval Number:    Date Approved/Denied:   PASRR Number:  [id number])  Discharge Plan: SNF    Current Diagnoses: Patient Active Problem List   Diagnosis Date Noted  . 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 Abnormal EKG 07/03/2016  . Biceps tendonitis on right 11/23/2014  . Osteoarthritis 08/09/2014  . Osteopenia 08/09/2014  . Obesity 08/09/2014  . Diabetes mellitus (HCC) 08/09/2014  . Hypertension 08/09/2014  . Hypercholesterolemia 08/09/2014    Orientation RESPIRATION BLADDER Height & Weight     Self, Time, Situation, Place  O2 (2 Liters Oxygen ) Continent Weight:   Height:     BEHAVIORAL SYMPTOMS/MOOD NEUROLOGICAL BOWEL NUTRITION STATUS   (none)  (none) Continent Diet (Diet: Carb Modified )  AMBULATORY STATUS COMMUNICATION OF NEEDS Skin   Extensive Assist Verbally Surgical wounds (Incision: Left Knee )                       Personal Care Assistance Level of Assistance  Bathing, Feeding, Dressing Bathing Assistance: Limited assistance Feeding assistance: Independent Dressing Assistance: Limited assistance     Functional Limitations Info  Sight, Hearing, Speech Sight Info: Adequate Hearing Info: Adequate Speech Info: Adequate    SPECIAL CARE FACTORS FREQUENCY  PT (By licensed PT), OT (By licensed OT)     PT Frequency:  (5) OT Frequency:  (5)            Contractures      Additional  Factors Info  Code Status, Allergies, Insulin Sliding Scale Code Status Info:  (Full Code. ) Allergies Info:  (Penicillins, Latex. )   Insulin Sliding Scale Info:  (NovoLog Insulin Injections. )       Current Medications (07/08/2016):  This is the current hospital active medication list Current Facility-Administered Medications  Medication Dose Route Frequency Provider Last Rate Last Dose  . 0.9 %  sodium chloride infusion   Intravenous Continuous Georgeana Oertel, Illene Labrador, MD      . acetaminophen (OFIRMEV) IV 1,000 mg  1,000 mg Intravenous Q6H Johnanna Bakke, Illene Labrador, MD      . acetaminophen (TYLENOL) tablet 650 mg  650 mg Oral Q6H PRN Jyra Lagares, Illene Labrador, MD       Or  . acetaminophen (TYLENOL) suppository 650 mg  650 mg Rectal Q6H PRN Loretta Kluender, Illene Labrador, MD      . alum & mag hydroxide-simeth (MAALOX/MYLANTA) 200-200-20 MG/5ML suspension 30 mL  30 mL Oral Q4H PRN Larz Mark, Illene Labrador, MD      . bisacodyl (DULCOLAX) suppository 10 mg  10 mg Rectal Daily PRN Ardean Simonich, Illene Labrador, MD      . celecoxib (CELEBREX) capsule 200 mg  200 mg Oral Q12H Pleasant Britz, Illene Labrador, MD      . clindamycin (CLEOCIN) IVPB 600 mg  600 mg Intravenous Q6H Blake Goya, Illene Labrador, MD      . diphenhydrAMINE (BENADRYL) 12.5 MG/5ML elixir 12.5-25 mg  12.5-25 mg Oral Q4H  PRN Donato HeinzHooten, [place] Dube P, MD      . Melene Muller[START ON 07/09/2016] enoxaparin (LOVENOX) injection 30 mg  30 mg Subcutaneous Q12H Joette Schmoker, Illene LabradorJames P, MD      . ferrous sulfate tablet 325 mg  325 mg Oral BID WC Marielis Samara, Illene LabradorJames P, MD      . losartan (COZAAR) tablet 100 mg  100 mg Oral Daily Elky Funches, Illene LabradorJames P, MD       And  . hydrochlorothiazide (MICROZIDE) capsule 12.5 mg  12.5 mg Oral Daily Sarah Baez, Illene LabradorJames P, MD      . insulin aspart (novoLOG) injection 0-15 Units  0-15 Units Subcutaneous TID WC Graelyn Bihl, Illene LabradorJames P, MD      . Melene Muller[START ON 07/09/2016] levothyroxine (SYNTHROID, LEVOTHROID) tablet 100 mcg  100 mcg Oral QAC breakfast Nilson Tabora, Illene LabradorJames P, MD      . loratadine (CLARITIN) tablet 10 mg  10 mg Oral Daily Vivianna Piccini, Illene LabradorJames P, MD      .  magnesium hydroxide (MILK OF MAGNESIA) suspension 30 mL  30 mL Oral Daily PRN Cristo Ausburn, Illene LabradorJames P, MD      . menthol-cetylpyridinium (CEPACOL) lozenge 3 mg  1 lozenge Oral PRN Itzel Mckibbin, Illene LabradorJames P, MD       Or  . phenol (CHLORASEPTIC) mouth spray 1 spray  1 spray Mouth/Throat PRN Leathie Weich, Illene LabradorJames P, MD      . metFORMIN (GLUCOPHAGE) tablet 500 mg  500 mg Oral BID WC Karena Kinker, Illene LabradorJames P, MD      . metoCLOPramide (REGLAN) tablet 10 mg  10 mg Oral TID AC & HS Lianette Broussard, Illene LabradorJames P, MD      . morphine 2 MG/ML injection 2 mg  2 mg Intravenous Q2H PRN Darrly Loberg, Illene LabradorJames P, MD      . multivitamin with minerals tablet 1 tablet  1 tablet Oral Daily Latisa Belay, Illene LabradorJames P, MD      . omega-3 acid ethyl esters (LOVAZA) capsule 1 g  1 g Oral BID Hazely Sealey, Illene LabradorJames P, MD      . ondansetron (ZOFRAN) tablet 4 mg  4 mg Oral Q6H PRN Lataisha Colan, Illene LabradorJames P, MD       Or  . ondansetron (ZOFRAN) injection 4 mg  4 mg Intravenous Q6H PRN Hilary Milks, Illene LabradorJames P, MD      . oxyCODONE (Oxy IR/ROXICODONE) immediate release tablet 5-10 mg  5-10 mg Oral Q4H PRN Darrien Laakso, Illene LabradorJames P, MD      . pantoprazole (PROTONIX) EC tablet 40 mg  40 mg Oral BID Sapna Padron, Illene LabradorJames P, MD      . rosuvastatin (CRESTOR) tablet 20 mg  20 mg Oral Daily Saber Dickerman, Illene LabradorJames P, MD      . senna-docusate (Senokot-S) tablet 1 tablet  1 tablet Oral BID Nadea Kirkland, Illene LabradorJames P, MD      . sodium phosphate (FLEET) 7-19 GM/118ML enema 1 enema  1 enema Rectal Once PRN Marquise Wicke, Illene LabradorJames P, MD      . thiamine (VITAMIN B-1) tablet 100 mg  100 mg Oral Daily Benedetta Sundstrom, Illene LabradorJames P, MD      . traMADol Janean Sark(ULTRAM) tablet 50-100 mg  50-100 mg Oral Q4H PRN Hania Cerone, Illene LabradorJames P, MD         Discharge Medications: Please see discharge summary for a list of discharge medications.  Relevant Imaging Results:  Relevant Lab Results:   Additional Information  (SSN: [id number])  Sample, Darleen CrockerBailey M, LCSW

## 2016-07-08 NOTE — Progress Notes (Signed)
T[place] over care [place]. Gave oral pain meds, with good relief. Blood sugar 148, gave ss insulin coverage as ordered. Tolerated diet without N&V. Reviewed POC. Bed alarm on for safety.

## 2016-07-08 NOTE — Anesthesia Procedure Notes (Signed)
Spinal  Patient location during procedure: OR Start time: 07/08/2016 10:55 AM End time: 07/08/2016 11:02 AM Staffing Anesthesiologist: Emmie Niemann Performed: anesthesiologist  Preanesthetic Checklist Completed: patient identified, site marked, surgical consent, pre-op evaluation, timeout performed, IV checked, risks and benefits discussed and monitors and equipment checked Spinal Block Patient position: sitting Prep: ChloraPrep Patient monitoring: heart rate, continuous pulse ox, blood pressure and cardiac monitor Approach: midline Location: L4-5 Injection technique: single-shot Needle Needle type: Introducer and Pencil-Tip  Needle gauge: 24 G Needle length: 9 cm Additional Notes Negative paresthesia. Negative blood return. Positive free-flowing CSF. Expiration date of kit checked and confirmed. Patient tolerated procedure well, without complications.

## 2016-07-08 NOTE — Op Note (Signed)
OPERATIVE NOTE  DATE OF SURGERY:  07/08/2016  PATIENT NAME:  Kim GuthrieJean M Digiulio   DOB: Jan 11, 1956  MRN: [id number]  PRE-OPERATIVE DIAGNOSIS: Degenerative arthrosis of the left knee, primary  POST-OPERATIVE DIAGNOSIS:  Same  PROCEDURE:  Left total knee arthroplasty using computer-assisted navigation  SURGEON:  Jena GaussJames P Hooten, Jr. M.D.  ASSISTANT:  Van ClinesJon Wolfe, PA (present and scrubbed throughout the case, critical for assistance with exposure, retraction, instrumentation, and closure)  ANESTHESIA: spinal  ESTIMATED BLOOD LOSS: 50 mL  FLUIDS REPLACED: 1500 mL of crystalloid  TOURNIQUET TIME: 83 minutes  DRAINS: 2 medium Hemovac drains  SOFT TISSUE RELEASES: Anterior cruciate ligament, posterior cruciate ligament, deep medial collateral ligament, patellofemoral ligament  IMPLANTS UTILIZED: DePuy Attune size 5N posterior stabilized femoral component (cemented), size 4 rotating platform tibial component (cemented), 32 mm medialized dome patella (cemented), and a 6 mm stabilized rotating platform polyethylene insert.  INDICATIONS FOR SURGERY: Kim GuthrieJean M Giammona is a 61 y.o. year old female with a long history of progressive knee pain. X-rays demonstrated severe degenerative changes in tricompartmental fashion. The patient had not seen any significant improvement despite conservative nonsurgical intervention. After discussion of the risks and benefits of surgical intervention, the patient expressed understanding of the risks benefits and agree with plans for total knee arthroplasty.   The risks, benefits, and alternatives were discussed at length including but not limited to the risks of infection, bleeding, nerve injury, stiffness, blood clots, the need for revision surgery, cardiopulmonary complications, among others, and they were willing to proceed.  PROCEDURE IN DETAIL: The patient was brought into the operating room and, after adequate spinal anesthesia was achieved, a tourniquet was placed on  the patient's upper thigh. The patient's knee and leg were cleaned and prepped with alcohol and DuraPrep and draped in the usual sterile fashion. A "timeout" was performed as per usual protocol. The lower extremity was exsanguinated using an Esmarch, and the tourniquet was inflated to 300 mmHg. An anterior longitudinal incision was made followed by a standard mid vastus approach. The deep fibers of the medial collateral ligament were elevated in a subperiosteal fashion off of the medial flare of the tibia so as to maintain a continuous soft tissue sleeve. The patella was subluxed laterally and the patellofemoral ligament was incised. Inspection of the knee demonstrated severe degenerative changes with full-thickness loss of articular cartilage. Osteophytes were debrided using a rongeur. Anterior and posterior cruciate ligaments were excised. Two 4.0 mm Schanz pins were inserted in the femur and into the tibia for attachment of the array of trackers used for computer-assisted navigation. Hip center was identified using a circumduction technique. Distal landmarks were mapped using the computer. The distal femur and proximal tibia were mapped using the computer. The distal femoral cutting guide was positioned using computer-assisted navigation so as to achieve a 5 distal valgus cut. The femur was sized and it was felt that a size 5N femoral component was appropriate. A size 5 femoral cutting guide was positioned and the anterior cut was performed and verified using the computer. This was followed by completion of the posterior and chamfer cuts. Femoral cutting guide for the central box was then positioned in the center box cut was performed.  Attention was then directed to the proximal tibia. Medial and lateral menisci were excised. The extramedullary tibial cutting guide was positioned using computer-assisted navigation so as to achieve a 0 varus-valgus alignment and 3 posterior slope. The cut was performed and  verified using the computer. The proximal tibia  was sized and it was felt that a size 4 tibial tray was appropriate. Tibial and femoral trials were inserted followed by insertion of a 6 mm polyethylene insert. This allowed for excellent mediolateral soft tissue balancing both in flexion and in full extension. Finally, the patella was cut and prepared so as to accommodate a 32 mm medialized dome patella. A patella trial was placed and the knee was placed through a range of motion with excellent patellar tracking appreciated. The femoral trial was removed after debridement of posterior osteophytes. The central post-hole for the tibial component was reamed followed by insertion of a keel punch. Tibial trials were then removed. Cut surfaces of bone were irrigated with copious amounts of normal saline with antibiotic solution using pulsatile lavage and then suctioned dry. Polymethylmethacrylate cement with gentamicin was prepared in the usual fashion using a vacuum mixer. Cement was applied to the cut surface of the proximal tibia as well as along the undersurface of a size 4 rotating platform tibial component. Tibial component was positioned and impacted into place. Excess cement was removed using Personal assistant. Cement was then applied to the cut surfaces of the femur as well as along the posterior flanges of the size 5N femoral component. The femoral component was positioned and impacted into place. Excess cement was removed using Personal assistant. A 6 mm polyethylene trial was inserted and the knee was brought into full extension with steady axial compression applied. Finally, cement was applied to the backside of a 32 mm medialized dome patella and the patellar component was positioned and patellar clamp applied. Excess cement was removed using Personal assistant. After adequate curing of the cement, the tourniquet was deflated after a total tourniquet time of 83 minutes. Hemostasis was achieved using electrocautery.  The knee was irrigated with copious amounts of normal saline with antibiotic solution using pulsatile lavage and then suctioned dry. 20 mL of 1.3% Exparel and 60 mL of 0.25% Marcaine in 40 mL of normal saline was injected along the posterior capsule, medial and lateral gutters, and along the arthrotomy site. A 6 mm stabilized rotating platform polyethylene insert was inserted and the knee was placed through a range of motion with excellent mediolateral soft tissue balancing appreciated and excellent patellar tracking noted. 2 medium drains were placed in the wound bed and brought out through separate stab incisions to be attached to a Hemovac reservoir. The medial parapatellar portion of the incision was reapproximated using interrupted sutures of #1 Vicryl. Subcutaneous tissue was approximated in layers using first #0 Vicryl followed #2-0 Vicryl. The skin was approximated with skin staples. A sterile dressing was applied.  The patient tolerated the procedure well and was transported to the recovery room in stable condition.    James P. Angie Fava., M.D.

## 2016-07-08 NOTE — Progress Notes (Signed)
Pt arrived from PACU via her bed. Pt is alert and oriented, in no distress, pt reports that both of her legs remain numb and is unable to wiggle her feet or her toes. Pt is on 2LNC, lungs clear bilat. Hr is regular, abdomen is soft, bs heard. Pt has foley draining yellow urine, l knee area has dressing intact, hemovac intact, and polar care intact. Foot pumps on bilat, ted hose on r leg. piv #20 intact to l hand with iv ns infusing per md order. Pt is oriented to room and call bell, is given menu, denied nausea, and pt ordered her meal. Husband arrived at bedside. This Clinical research associatewriter reviewed MD orders with pt including meds and bone foam.

## 2016-07-08 NOTE — Progress Notes (Signed)
PHARMACIST - PHYSICIAN ORDER COMMUNICATION  CONCERNING: P&T Medication Policy on Herbal Medications  DESCRIPTION:  This patient's order for:  Vitamins B, Calcium, D and K  have been noted.  This product(s) is classified as an "herbal" or natural product. Due to a lack of definitive safety studies or FDA approval, nonstandard manufacturing practices, plus the potential risk of unknown drug-drug interactions while on inpatient medications, the Pharmacy and Therapeutics Committee does not permit the use of "herbal" or natural products of this type within [facility].   ACTION TAKEN: The pharmacy department is unable to verify this order at this time and your patient has been informed of this safety policy. Please reevaluate patient's clinical condition at discharge and address if the herbal or natural product(s) should be resumed at that time.

## 2016-07-09 LAB — CBC
HCT: 39.4 % (ref 35.0–47.0)
HEMOGLOBIN: 13.4 g/dL (ref 12.0–16.0)
MCH: 29.8 pg (ref 26.0–34.0)
MCHC: 34 g/dL (ref 32.0–36.0)
MCV: 87.7 fL (ref 80.0–100.0)
PLATELETS: 263 10*3/uL (ref 150–440)
RBC: 4.49 MIL/uL (ref 3.80–5.20)
RDW: 13 % (ref 11.5–14.5)
WBC: 13.9 10*3/uL — AB (ref 3.6–11.0)

## 2016-07-09 LAB — BASIC METABOLIC PANEL
ANION GAP: 7 (ref 5–15)
BUN: 21 mg/dL — ABNORMAL HIGH (ref 6–20)
CHLORIDE: 104 mmol/L (ref 101–111)
CO2: 25 mmol/L (ref 22–32)
Calcium: 8.5 mg/dL — ABNORMAL LOW (ref 8.9–10.3)
Creatinine, Ser: 0.73 mg/dL (ref 0.44–1.00)
GFR calc Af Amer: >60 mL/min (ref >60)
Glucose, Bld: 144 mg/dL — ABNORMAL HIGH (ref 65–99)
POTASSIUM: 4.1 mmol/L (ref 3.5–5.1)
SODIUM: 136 mmol/L (ref 135–145)

## 2016-07-09 LAB — GLUCOSE, CAPILLARY
Glucose-Capillary: 130 mg/dL — ABNORMAL HIGH (ref 65–99)
Glucose-Capillary: 136 mg/dL — ABNORMAL HIGH (ref 65–99)
Glucose-Capillary: 174 mg/dL — ABNORMAL HIGH (ref 65–99)

## 2016-07-09 NOTE — Progress Notes (Signed)
Foley d/c'd at 0520 

## 2016-07-09 NOTE — Discharge Instructions (Signed)

## 2016-07-09 NOTE — Anesthesia Postprocedure Evaluation (Signed)
Anesthesia Post Note  Patient: Kim Hancock  Procedure(s) Performed: Procedure(s) (LRB): COMPUTER ASSISTED TOTAL KNEE ARTHROPLASTY (Left)  Patient location during evaluation: Nursing Unit Anesthesia Type: Spinal Level of consciousness: oriented and awake and alert Pain management: pain level controlled Vital Signs Assessment: post-procedure vital signs reviewed and stable Respiratory status: spontaneous breathing, respiratory function stable and patient connected to nasal cannula oxygen Cardiovascular status: blood pressure returned to baseline and stable Postop Assessment: no headache and no backache Anesthetic complications: no     Last Vitals:  Vitals:   07/09/16 0723 07/09/16 0748  BP:  92/77  Pulse: (!) 48 (!) 49  Resp:    Temp:      Last Pain:  Vitals:   07/09/16 0717  TempSrc: Oral  PainSc:                  Kim Hancock,  Kim Hancock

## 2016-07-09 NOTE — Progress Notes (Signed)
Clinical Child psychotherapistocial Worker (CSW) received SNF consult. PT is recommending home health vs. Outpatient PT. RN case manager aware of above. Please reconsult if future social work needs arise. CSW signing off.   Baker Hughes IncorporatedBailey Milledge Gerding, LCSW [phone]

## 2016-07-09 NOTE — Progress Notes (Signed)
POD 1. Alert and oriented. VSS. Pain controlled with PO pain meds. Pt. Dangled to the side of the bed with minimal assistance. Polar care on and filled with ice. Pt. Tolerated bone foam all night. Neurochecks WDL. Rested quietly throughout the night. Will continue to monitor.

## 2016-07-09 NOTE — Evaluation (Signed)
Occupational Therapy Evaluation Patient Details Name: Kim Hancock MRN: [id number] DOB: 03-19-1955 Today's Date: 07/09/2016    History of Present Illness 61yo female pt s/p L TKA, POD#1. PMHx significant for HTN, HLD, osteopenia, OA, hx MI, DM, hypothyroidism.     Clinical Impression   Pt seen for OT evaluation this date. Pt independent at baseline, working full time as a Psychologist, forensic, and more limited with functional mobility in the last 6 months due to pain, needing a SPC for ambulation and unable to walk around the neighborhood as much as she liked to. Pt presents with minimal pain/decreased ROM/decreased strength in L knee as well as decreased knowledge of AE for self care tasks. Pt would benefit from skilled OT services to address noted impairments and functional deficits in LB ADL including toilet transfers in order to maximize return to PLOF and minimize falls risk prior to return home.    Follow Up Recommendations  No OT follow up    Equipment Recommendations  3 in 1 bedside commode    Recommendations for Other Services       Precautions / Restrictions Precautions Precautions: Knee Precaution Booklet Issued: No Restrictions Weight Bearing Restrictions: Yes (LLE WBAT)      Mobility Bed Mobility Overal bed mobility: Modified Independent             General bed mobility comments: slightly additional time needed, no physical assist required, HOB elevated slightly  Transfers Overall transfer level: Needs assistance Equipment used: Rolling walker (2 wheeled) Transfers: Sit to/from Stand Sit to Stand: Min guard         General transfer comment: initial verbal cue for hand placement with excellent carry over of safe techniques     Balance Overall balance assessment: Needs assistance Sitting-balance support: No upper extremity supported;Feet supported Sitting balance-Leahy Scale: Normal     Standing balance support: Bilateral upper extremity  supported;During functional activity Standing balance-Leahy Scale: Good Standing balance comment: good confidence, no LOB noted                           ADL either performed or assessed with clinical judgement   ADL Overall ADL's : Needs assistance/impaired Eating/Feeding: Set up;Sitting   Grooming: Set up;Sitting   Upper Body Bathing: Sitting;Set up   Lower Body Bathing: Sitting/lateral leans;Set up;Supervison/ safety   Upper Body Dressing : Sitting;Set up   Lower Body Dressing: Set up;Min guard;Sit to/from stand Lower Body Dressing Details (indicate cue type and reason): pt educated in use of AE for LB dressing as well as compression stocking mgt, pt verbalized understanding Toilet Transfer: Min Social worker Details (indicate cue type and reason): pt took approx 2 steps to turn and sit on BSC with min guard using RW for ambulation, good safe technique and hand placement utilized Psychiatrist and Hygiene: Set up;Sitting/lateral lean       Functional mobility during ADLs: Rolling walker;Min guard General ADL Comments: Pt generally at supervision/min guard level during transitional movements such as transfers; pt demonstrates good confidence and safety awareness with all functional mobility     Vision Baseline Vision/History: Wears glasses Wears Glasses: At all times Patient Visual Report: No change from baseline Vision Assessment?: No apparent visual deficits     Perception     Praxis      Pertinent Vitals/Pain Pain Assessment: 0-10 Pain Score: 1  Pain Location: L knee with movement Pain Descriptors / Indicators: Aching Pain Intervention(s):  Limited activity within patient's tolerance;Monitored during session;Premedicated before session;Repositioned;Ice applied     Hand Dominance Right   Extremity/Trunk Assessment Upper Extremity Assessment Upper Extremity Assessment: Overall WFL for tasks assessed    Lower Extremity Assessment Lower Extremity Assessment: Defer to PT evaluation;LLE deficits/detail   Cervical / Trunk Assessment Cervical / Trunk Assessment: Normal   Communication Communication Communication: No difficulties   Cognition Arousal/Alertness: Awake/alert Behavior During Therapy: WFL for tasks assessed/performed Overall Cognitive Status: Within Functional Limits for tasks assessed                                     General Comments       Exercises Other Exercises Other Exercises: pt educated in home/routines modifications to maximize safety in the home, pt verbalized understanding   Shoulder Instructions      Home Living Family/patient expects to be discharged to:: Private residence Living Arrangements: Spouse/significant other;Children (25yo son) Available Help at Discharge: Family;Available 24 hours/day (husband retired, son also available to help) Type of Home: House Home Access: Stairs to enter [employer] of Steps: 3 Entrance Stairs-Rails: None Home Layout: One level;Laundry or work area in [employer] Shower/Tub: Tub/shower unit;Walk-in shower (rec pt use walk in shower initially)   Bathroom Toilet: Standard Bathroom Accessibility: Yes How Accessible: Accessible via walker Home Equipment: Cane - single point          Prior Functioning/Environment Level of Independence: Independent with assistive device(s)        Comments: pt using SPC for past 6 months due to knee pain; indep with ADL, IADL, working full time as high school Engineer, structural; enjoys walking and using recumbent bike at home but hasn't been able to be as active recently due to pain        OT Problem List: Decreased range of motion;Decreased knowledge of use of DME or AE      OT Treatment/Interventions: Self-care/ADL training;Therapeutic activities;Energy conservation;DME and/or AE instruction;Patient/family education     OT Goals(Current goals can be found in the care plan section) Acute Rehab OT Goals Patient Stated Goal: go home  OT Goal Formulation: With patient Time For Goal Achievement: 07/23/16 Potential to Achieve Goals: Good  OT Frequency: Min 1X/week   Barriers to D/C:            Co-evaluation              AM-PAC PT "6 Clicks" Daily Activity     Outcome Measure Help from another person eating meals?: None Help from another person taking care of personal grooming?: None Help from another person toileting, which includes using toliet, bedpan, or urinal?: A Little Help from another person bathing (including washing, rinsing, drying)?: A Little Help from another person to put on and taking off regular upper body clothing?: None Help from another person to put on and taking off regular lower body clothing?: A Little 6 Click Score: 21   End of Session Equipment Utilized During Treatment: Gait belt;Rolling walker  Activity Tolerance: Patient tolerated treatment well Patient left: in chair;with call bell/phone within reach;with chair alarm set;with SCD's reapplied;Other (comment) (polar care in place)  OT Visit Diagnosis: Other abnormalities of gait and mobility (R26.89)                Time: 1610-9604 OT Time Calculation (min): 34 min Charges:  OT General Charges $OT Visit: 1 Procedure  OT Evaluation $OT Eval Low Complexity: 1 Procedure OT Treatments $Self Care/Home Management : 8-22 mins G-Codes:     Richrd PrimeJamie Stiller, MPH, MS, OTR/L ascom [phone] 07/09/16, 9:35 AM

## 2016-07-09 NOTE — Progress Notes (Signed)
Pts BP 98/48, HR 48. PA Jon notified and aware. Hold morning dose of BP meds.  No new orders at this time.

## 2016-07-09 NOTE — Discharge Summary (Signed)
Physician Discharge Summary  Patient ID: Kim Hancock MRN: [id number] DOB/AGE: 05/15/1955 61 y.o.  Admit date: 07/08/2016 Discharge date: 07/10/2016  Admission Diagnoses:  PRIMARY OSTEOARTHRITIS OF LEFT KNEE   Discharge Diagnoses: Patient Active Problem List   Diagnosis Date Noted  . 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 Abnormal EKG 07/03/2016  . Biceps tendonitis on right 11/23/2014  . Osteoarthritis 08/09/2014  . Osteopenia 08/09/2014  . Obesity 08/09/2014  . Diabetes mellitus (HCC) 08/09/2014  . Hypertension 08/09/2014  . Hypercholesterolemia 08/09/2014    Past Medical History:  Diagnosis Date  . Diabetes mellitus without complication (HCC)   . Environmental and seasonal allergies   . Hyperlipidemia   . Hypertension   . Hypothyroidism   . Myocardial infarction (HCC)   . Obesity   . Osteoarthritis   . Osteopenia      Transfusion: No transfusions during this admission   Consultants (if any):   Discharged Condition: Improved  Hospital Course: Kim Hancock is an 61 y.o. female who was admitted 07/08/2016 with a diagnosis of degenerative arthrosis left knee and went to the operating room on 07/08/2016 and underwent the above named procedures.    Surgeries:Procedure(s): COMPUTER ASSISTED TOTAL KNEE ARTHROPLASTY on 07/08/2016  PRE-OPERATIVE DIAGNOSIS: Degenerative arthrosis of the left knee, primary  POST-OPERATIVE DIAGNOSIS:  Same  PROCEDURE:  Left total knee arthroplasty using computer-assisted navigation  SURGEON:  Jena Gauss. M.D.  ASSISTANT:  Van Clines, PA (present and scrubbed throughout the case, critical for assistance with exposure, retraction, instrumentation, and closure)  ANESTHESIA: spinal  ESTIMATED BLOOD LOSS: 50 mL  FLUIDS REPLACED: 1500 mL of crystalloid  TOURNIQUET TIME: 83 minutes  DRAINS: 2 medium Hemovac drains  SOFT TISSUE RELEASES: Anterior cruciate ligament, posterior cruciate ligament, deep medial collateral  ligament, patellofemoral ligament  IMPLANTS UTILIZED: DePuy Attune size 5N posterior stabilized femoral component (cemented), size 4 rotating platform tibial component (cemented), 32 mm medialized dome patella (cemented), and a 6 mm stabilized rotating platform polyethylene insert.  INDICATIONS FOR SURGERY: Kim Hancock is a 61 y.o. year old female year old female with a long history of progressive knee pain. X-rays demonstrated severe degenerative changes in tricompartmental fashion. The patient had not seen any significant improvement despite conservative nonsurgical intervention. After discussion of the risks and benefits of surgical intervention, the patient expressed understanding of the risks benefits and agree with plans for total knee arthroplasty.   The risks, benefits, and alternatives were discussed at length including but not limited to the risks of infection, bleeding, nerve injury, stiffness, blood clots, the need for revision surgery, cardiopulmonary complications, among others, and they were willing to proceed. Patient tolerated the surgery well. No complications .Patient was taken to PACU where she was stabilized and then transferred to the orthopedic floor.  Patient started on Lovenox 30 mg q 12 hrs. Foot pumps applied bilaterally at 80 mm hgb. Heels elevated off bed with rolled towels. No evidence of DVT. Calves non tender. Negative Homan. Physical therapy started on day #1 for gait training and transfer with OT starting on  day #1 for ADL and assisted devices. Patient has done well with therapy. Ambulated greater than 200 feet upon being discharged.  Patient's IV And Foley were discontinued on day #1 with Hemovac being discontinued on day #2. Dressing was changed on day 2 prior to patient being discharged   She was given perioperative antibiotics:  Anti-infectives    Start     Dose/Rate Route Frequency Ordered Stop   07/08/16 1700  clindamycin (CLEOCIN) IVPB 600 mg     600 mg 100 mL/hr  over 30 Minutes Intravenous Every 6 hours 07/08/16 1522 07/09/16 1659   07/08/16 0600  clindamycin (CLEOCIN) IVPB 900 mg     900 mg 100 mL/hr over 30 Minutes Intravenous On call to O.R. 07/07/16 2250 07/08/16 1123    .  She was fitted with AV 1 compression foot pump devices, instructed on heel pumps, early ambulation, and fitted with TED stockings bilaterally for DVT prophylaxis.  She benefited maximally from the hospital stay and there were no complications.    Recent vital signs:  Vitals:   07/09/16 0723 07/09/16 0748  BP:  92/77  Pulse: (!) 48 (!) 49  Resp:    Temp:      Recent laboratory studies:  Lab Results  Component Value Date   HGB 13.4 07/09/2016   HGB 15.5 06/26/2016   Lab Results  Component Value Date   WBC 13.9 (H) 07/09/2016   PLT 263 07/09/2016   Lab Results  Component Value Date   INR 0.96 06/26/2016   Lab Results  Component Value Date   NA 136 07/09/2016   K 4.1 07/09/2016   CL 104 07/09/2016   CO2 25 07/09/2016   BUN 21 (H) 07/09/2016   CREATININE 0.73 07/09/2016   GLUCOSE 144 (H) 07/09/2016    Discharge Medications:   Allergies as of 07/10/2016      Reactions   Penicillins Other (See Comments)   Passed out Has patient had a PCN reaction causing immediate rash, facial/tongue/throat swelling, SOB or lightheadedness with hypotension: No Has patient had a PCN reaction causing severe rash involving mucus membranes or skin necrosis: No Has patient had a PCN reaction that required hospitalization: No Has patient had a PCN reaction occurring within the last 10 years: No If all of the above answers are "NO", then may proceed with Cephalosporin use.   Latex Swelling, Other (See Comments)   Mouth swells and gets headache      Medication List    STOP taking these medications   mupirocin ointment 2 % Commonly known as:  BACTROBAN   sulfamethoxazole-trimethoprim 800-160 MG tablet Commonly known as:  BACTRIM DS,SEPTRA DS     TAKE these  medications   aspirin 81 MG tablet Take 1 tablet (81 mg total) by mouth daily. Start taking on:  07/24/2016 What changed:  These instructions start on 07/24/2016. If you are unsure what to do until then, ask your doctor or other care provider.   b complex vitamins tablet Take 1 tablet by mouth daily.   CALCIUM + D + K PO Take 1 tablet by mouth daily.   Coenzyme Q10 200 MG capsule Take 200 mg by mouth at bedtime.   empagliflozin 25 MG Tabs tablet Commonly known as:  JARDIANCE Take 25 mg by mouth daily.   enoxaparin 30 MG/0.3ML injection Commonly known as:  LOVENOX Inject 0.4 mLs (40 mg total) into the skin daily.   fexofenadine 180 MG tablet Commonly known as:  ALLEGRA Take 180 mg by mouth daily.   Fish Oil 1200 MG Caps Take 1,200 mg by mouth 2 (two) times daily.   RA FISH OIL 900 MG Caps Take 900 mg by mouth 2 (two) times daily.   Glucosamine-Chondroitin 750-600 MG Tabs Take 1 tablet by mouth 2 (two) times daily.   levothyroxine 100 MCG tablet Commonly known as:  SYNTHROID, LEVOTHROID Take 1 tablet (100 mcg total) by mouth daily. What changed:  when  to take this   losartan-hydrochlorothiazide 100-12.5 MG tablet Commonly known as:  HYZAAR Take 1 tablet by mouth daily.   metFORMIN 500 MG tablet Commonly known as:  GLUCOPHAGE TAKE 1 TABLET BY MOUTH TWICE DAILY WITH A MEAL   multivitamin with minerals Tabs tablet Take 1 tablet by mouth daily. Women's 50+   naproxen sodium 220 MG tablet Commonly known as:  ANAPROX Take 1 tablet (220 mg total) by mouth daily. Start taking on:  07/24/2016 What changed:  These instructions start on 07/24/2016. If you are unsure what to do until then, ask your doctor or other care provider.   oxyCODONE 5 MG immediate release tablet Commonly known as:  Oxy IR/ROXICODONE Take 1-2 tablets (5-10 mg total) by mouth every 4 (four) hours as needed for severe pain or breakthrough pain.   rosuvastatin 20 MG tablet Commonly known as:   CRESTOR TAKE 1 TABLET(20 MG) BY MOUTH DAILY What changed:  See the new instructions.   thiamine 100 MG tablet Commonly known as:  VITAMIN B-1 Take 100 mg by mouth daily.   traMADol 50 MG tablet Commonly known as:  ULTRAM Take 1-2 tablets (50-100 mg total) by mouth every 4 (four) hours as needed for moderate pain.            Durable Medical Equipment        Start     Ordered   07/08/16 1522  DME Walker rolling  Once    Question:  Patient needs a walker to treat with the following condition  Answer:  Total knee replacement status   07/08/16 1522   07/08/16 1522  DME Bedside commode  Once    Question:  Patient needs a bedside commode to treat with the following condition  Answer:  Total knee replacement status   07/08/16 1522      Diagnostic Studies: Dg Knee Left Port  Result Date: 07/08/2016 CLINICAL DATA:  Status post knee replacement EXAM: PORTABLE LEFT KNEE - 1-2 VIEW COMPARISON:  06/27/2011 FINDINGS: Cutaneous staples. Status post left knee replacement with normal alignment. No fracture. Soft tissue drain in the suprapatellar region. Scattered pockets of soft tissue gas felt consistent with recent surgery. IMPRESSION: Status post left knee replacement with expected postsurgical changes Electronically Signed   By: Jasmine Pang M.D.   On: 07/08/2016 14:38    Disposition: Final discharge disposition not confirmed    Follow-up Information    Tera Partridge, PA On 07/23/2016.   Specialty:  Physician Assistant Why:  at 2:15pm Contact information: [address] [facility] [place] [address] [phone]        Donato Heinz, MD On 08/20/2016.   Specialty:  Orthopedic Surgery Why:  at 9:45am Contact information: [address] [facility] [place] [address] [phone]            Signed: Tera Partridge. 07/09/2016, 7:59 AM

## 2016-07-09 NOTE — Evaluation (Signed)
Physical Therapy Evaluation Patient Details Name: Kim GuthrieJean M Hancock MRN: [id number] DOB: [birth year] Today's Date: 07/09/2016   History of Present Illness  61yo female pt s/p L TKA. PMHx significant for HTN, HLD, osteopenia, OA, hx MI, DM, hypothyroidism.    Clinical Impression  Pt showed great motivation t/o PT exam and was able to achieve all typical POD1 milestones with relative ease.  She had very minimal pain t/o the session (none on arrival) and showed great effort with very minimal guarding with ambulation and the 12 minutes of exercises apart from the exam.  Overall pt is progressing nicely.    Follow Up Recommendations Home health PT;Outpatient PT (per progress)    Equipment Recommendations  Rolling walker with 5" wheels    Recommendations for Other Services       Precautions / Restrictions Precautions Precautions: Knee Precaution Booklet Issued: No Restrictions Weight Bearing Restrictions: Yes (LLE WBAT) LLE Weight Bearing: Weight bearing as tolerated      Mobility  Bed Mobility Overal bed mobility: Modified Independent             General bed mobility comments: slightly additional time needed, no physical assist required, HOB elevated slightly  Transfers Overall transfer level: Modified independent Equipment used: Rolling walker (2 wheeled) Transfers: Sit to/from Stand Sit to Stand: Supervision         General transfer comment: minimal cuing for set up, sequencing and handplacement, but ultimately good safety/confidence  Ambulation/Gait Ambulation/Gait assistance: Min guard Ambulation Distance (Feet): 75 Feet Assistive device: Rolling walker (2 wheeled)       General Gait Details: Pt did well with consistent and relatively confident cadence.  She had some expected fatigue but manipulated the walker well, showed good WBing tolerance and generally did well.   Stairs            Wheelchair Mobility    Modified Rankin (Stroke Patients Only)        Balance Overall balance assessment: Modified Independent Sitting-balance support: No upper extremity supported;Feet supported Sitting balance-Leahy Scale: Normal     Standing balance support: Bilateral upper extremity supported;During functional activity Standing balance-Leahy Scale: Good Standing balance comment: good confidence, no LOB noted                             Pertinent Vitals/Pain Pain Assessment: 0-10 Pain Score: 4  (no pain at rest, max pain 4/10 during knee flexion PROM) Pain Location: L knee with movement Pain Descriptors / Indicators: Aching Pain Intervention(s): Limited activity within patient's tolerance;Monitored during session;Premedicated before session;Repositioned;Ice applied    Home Living Family/patient expects to be discharged to:: Private residence Living Arrangements: Spouse/significant other;Children Available Help at Discharge: Family;Available 24 hours/day Type of Home: House Home Access: Stairs to enter Entrance Stairs-Rails: None Entrance Stairs-Number of Steps: 3 Home Layout: One level;Laundry or work area in [employer]ement Home Equipment: Gilmer MorCane - single point      Prior Function Level of Independence: Independent with assistive device(s)         Comments: pt using SPC for past 6 months due to knee pain; indep with ADL, IADL, working full time as high school Engineer, structuralpanish teacher and technology teacher; enjoys walking and using recumbent bike at home but hasn't been able to be as active recently due to pain     Hand Dominance   Dominant Hand: Right    Extremity/Trunk Assessment   Upper Extremity Assessment Upper Extremity Assessment: Defer to OT evaluation  Lower Extremity Assessment Lower Extremity Assessment: LLE deficits/detail;Overall WFL for tasks assessed LLE Deficits / Details: expected L LE post-op weakness, but able to do 10 SLRs, showed minimal hesitancy and grossly 4-/5 strength t/o    Cervical / Trunk  Assessment Cervical / Trunk Assessment: Normal  Communication   Communication: No difficulties  Cognition Arousal/Alertness: Awake/alert Behavior During Therapy: WFL for tasks assessed/performed Overall Cognitive Status: Within Functional Limits for tasks assessed                                        General Comments      Exercises Total Joint Exercises Ankle Circles/Pumps: AROM;10 reps Quad Sets: Strengthening;10 reps Gluteal Sets: Strengthening;10 reps Heel Slides: Strengthening;10 reps Hip ABduction/ADduction: Strengthening;10 reps Straight Leg Raises: AROM;10 reps Long Arc Quad: AROM;Strengthening;10 reps Knee Flexion: PROM;5 reps Goniometric ROM: 0-77 Other Exercises Other Exercises: pt educated in home/routines modifications to maximize safety in the home, pt verbalized understanding   Assessment/Plan    PT Assessment Patient needs continued PT services  PT Problem List Decreased strength;Decreased range of motion;Decreased activity tolerance;Decreased balance;Decreased mobility;Decreased safety awareness;Pain;Decreased knowledge of use of DME       PT Treatment Interventions DME instruction;Gait training;Stair training;Functional mobility training;Therapeutic activities;Therapeutic exercise;Balance training;Patient/family education    PT Goals (Current goals can be found in the Care Plan section)  Acute Rehab PT Goals Patient Stated Goal: go home  PT Goal Formulation: With patient Time For Goal Achievement: 07/23/16 Potential to Achieve Goals: Good    Frequency BID   Barriers to discharge        Co-evaluation               AM-PAC PT "6 Clicks" Daily Activity  Outcome Measure Difficulty turning over in bed (including adjusting bedclothes, sheets and blankets)?: A Little Difficulty moving from lying on back to sitting on the side of the bed? : A Little Difficulty sitting down on and standing up from a chair with arms (e.g.,  wheelchair, bedside commode, etc,.)?: A Little Help needed moving to and from a bed to chair (including a wheelchair)?: A Little Help needed walking in hospital room?: A Little Help needed climbing 3-5 steps with a railing? : A Little 6 Click Score: 18    End of Session Equipment Utilized During Treatment: Gait belt Activity Tolerance: Patient tolerated treatment well Patient left: with chair alarm set;with call bell/phone within reach;with family/visitor present   PT Visit Diagnosis: Muscle weakness (generalized) (M62.81);Difficulty in walking, not elsewhere classified (R26.2)    Time: 4098-1191 PT Time Calculation (min) (ACUTE ONLY): 29 min   Charges:   PT Evaluation $PT Eval Low Complexity: 1 Procedure PT Treatments $Therapeutic Exercise: 8-22 mins   PT G Codes:        Malachi Pro, DPT 07/09/2016, 12:01 PM

## 2016-07-09 NOTE — Progress Notes (Signed)
Physical Therapy Treatment Patient Details Name: Kim Hancock MRN: [id number] DOB: 03/01/1955 Today's Date: 07/09/2016    History of Present Illness 61yo female pt s/p L TKA. PMHx significant for HTN, HLD, osteopenia, OA, hx MI, DM, hypothyroidism.      PT Comments    Pt did well with ambulation and was able to show consistent forward motion with minimal walker reliance.  She showed great effort and continues to have minimal relative pain.  She was able to "easily" do SLRs and showed good tolerance with ROM activities.  She was also able to negotiate up/down steps with backward strategy and showed good safety.  Pt progressing nicely.     Follow Up Recommendations  Outpatient PT     Equipment Recommendations  Rolling walker with 5" wheels    Recommendations for Other Services       Precautions / Restrictions Precautions Precautions: Knee Restrictions LLE Weight Bearing: Weight bearing as tolerated    Mobility  Bed Mobility               General bed mobility comments: pt in recliner, returned to recliner  Transfers Overall transfer level: Modified independent Equipment used: Rolling walker (2 wheeled) Transfers: Sit to/from Stand Sit to Stand: Supervision         General transfer comment: minimal cuing for set up, sequencing and handplacement, but ultimately good safety/confidence  Ambulation/Gait Ambulation/Gait assistance: Min guard Ambulation Distance (Feet): 100 Feet Assistive device: Rolling walker (2 wheeled)       General Gait Details: Pt is able to ambulate well with decreased use of walker.  She was able to maintain consistent walker movement with decreased UE reliance/use.  Pt with some minimal fatigue, but overall did well.    Stairs Stairs: Yes   Stair Management: No rails;Backwards;With walker Number of Stairs: 4 General stair comments: Pt did well with stair negotiation, husband was able to help with stabilizing walker.  Pt remembered  sequencing order.  Wheelchair Mobility    Modified Rankin (Stroke Patients Only)       Balance Overall balance assessment: Modified Independent                                          Cognition Arousal/Alertness: Awake/alert Behavior During Therapy: WFL for tasks assessed/performed Overall Cognitive Status: Within Functional Limits for tasks assessed                                        Exercises Total Joint Exercises Ankle Circles/Pumps: AROM;10 reps Quad Sets: Strengthening;10 reps Gluteal Sets: Strengthening;10 reps Heel Slides: Strengthening;10 reps Hip ABduction/ADduction: Strengthening;10 reps Straight Leg Raises: AROM;10 reps Long Arc Quad: Strengthening;10 reps Knee Flexion: PROM;5 reps Goniometric ROM: >80    General Comments        Pertinent Vitals/Pain Pain Score: 2  (minimal increase with ROM, generally very tolerable)    Home Living                      Prior Function            PT Goals (current goals can now be found in the care plan section) Progress towards PT goals: Progressing toward goals    Frequency    BID      PT Plan  Current plan remains appropriate    Co-evaluation              AM-PAC PT "6 Clicks" Daily Activity  Outcome Measure  Difficulty turning over in bed (including adjusting bedclothes, sheets and blankets)?: None Difficulty moving from lying on back to sitting on the side of the bed? : None Difficulty sitting down on and standing up from a chair with arms (e.g., wheelchair, bedside commode, etc,.)?: None Help needed moving to and from a bed to chair (including a wheelchair)?: None Help needed walking in hospital room?: None Help needed climbing 3-5 steps with a railing? : A Little 6 Click Score: 23    End of Session Equipment Utilized During Treatment: Gait belt Activity Tolerance: Patient tolerated treatment well Patient left: with chair alarm set;with call  bell/phone within reach;with family/visitor present   PT Visit Diagnosis: Muscle weakness (generalized) (M62.81);Difficulty in walking, not elsewhere classified (R26.2)     Time: 4098-11911410-1438 PT Time Calculation (min) (ACUTE ONLY): 28 min  Charges:  $Gait Training: 8-22 mins $Therapeutic Exercise: 8-22 mins                    G Codes:       Malachi ProGalen R Tobenna Needs, DPT 07/09/2016, 3:47 PM

## 2016-07-09 NOTE — Progress Notes (Signed)
   Subjective: 1 Day Post-Op Procedure(s) (LRB): COMPUTER ASSISTED TOTAL KNEE ARTHROPLASTY (Left) Patient reports pain as 2 on 0-10 scale.   Patient is well, and has had no acute complaints or problems We will start therapy today.  Plan is to go Home after hospital stay. no nausea and no vomiting Patient denies any chest pains or shortness of breath. Patient is slightly hypotensive as well as bradycardia but is asymptomatic. She has good urine output. Patient states that she slept great during the night.  Objective: Vital signs in last 24 hours: Temp:  [97.1 F (36.2 C)-98.4 F (36.9 C)] 97.7 F (36.5 C) (06/05 0717) Pulse Rate:  [44-78] 48 (06/05 0723) Resp:  [10-24] 17 (06/05 0717) BP: (93-159)/(58-92) 98/58 (06/05 0722) SpO2:  [93 %-100 %] 93 % (06/05 0722) Heels are non tender and elevated off the bed using rolled towels as well as bone foam under operative leg  Intake/Output from previous day: 06/04 0701 - 06/05 0700 In: 3346.7 [P.O.:290; I.V.:2756.7; IV Piggyback:300] Out: 4605 [Urine:4325; Drains:230; Blood:50] Intake/Output this shift: No intake/output data recorded.   Recent Labs  07/09/16 0340  HGB 13.4    Recent Labs  07/09/16 0340  WBC 13.9*  RBC 4.49  HCT 39.4  PLT 263    Recent Labs  07/09/16 0340  NA 136  K 4.1  CL 104  CO2 25  BUN 21*  CREATININE 0.73  GLUCOSE 144*  CALCIUM 8.5*   No results for input(s): LABPT, INR in the last 72 hours.  EXAM General - Patient is Alert, Appropriate and Oriented Extremity - Neurologically intact Neurovascular intact Sensation intact distally Intact pulses distally Dorsiflexion/Plantar flexion intact Compartment soft Dressing - dressing C/D/I Motor Function - intact, moving foot and toes well on exam.    Past Medical History:  Diagnosis Date  . Diabetes mellitus without complication (HCC)   . Environmental and seasonal allergies   . Hyperlipidemia   . Hypertension   . Hypothyroidism   .  Myocardial infarction (HCC)   . Obesity   . Osteoarthritis   . Osteopenia     Assessment/Plan: 1 Day Post-Op Procedure(s) (LRB): COMPUTER ASSISTED TOTAL KNEE ARTHROPLASTY (Left) Active Problems:   S/P total knee arthroplasty  Estimated body mass index is 30.42 kg/m as calculated from the following:   Height as of 06/26/16: 5\' 4"  (1.626 m).   Weight as of 07/03/16: 80.4 kg (177 lb 3.2 oz). Advance diet Up with therapy D/C IV fluids Plan for discharge tomorrow Discharge home with home health  Labs: Were reviewed and acceptable DVT Prophylaxis - Lovenox, Foot Pumps and TED hose Weight-Bearing as tolerated to left leg D/C O2 and Pulse OX and try on Room Air We'll hold blood pressure medicine this morning. Begin working on bowel movement Labs tomorrow morning  Lynnda ShieldsJon R. Cleveland Asc [facility] Dba Cleveland Surgical SuitesWolfe PA [facility] 07/09/2016, 7:52 AM

## 2016-07-09 NOTE — Care Management Note (Signed)
Case Management Note  Patient Details  Name: Kim Hancock MRN: [id number] Date of Birth: [birth year]  Subjective/Objective:  [facility] for [facility] planning. PT still pending home vs. OP PT. Will follow progression and arrange discharge disposition.                  Action/Plan:   Expected Discharge Date:                  Expected Discharge Plan:     In-House Referral:     Discharge planning Services  CM Consult  Post Acute Care Choice:    Choice offered to:     DME Arranged:    DME Agency:     HH Arranged:    HH Agency:     Status of Service:  In process, will continue to follow  If discussed at Long Length of Stay Meetings, dates discussed:    Additional Comments:  Marily MemosLisa M Erman Thum, RN 07/09/2016, 2:26 PM

## 2016-07-10 LAB — BASIC METABOLIC PANEL
ANION GAP: 9 (ref 5–15)
BUN: 18 mg/dL (ref 6–20)
CALCIUM: 8.6 mg/dL — AB (ref 8.9–10.3)
CO2: 27 mmol/L (ref 22–32)
Chloride: 103 mmol/L (ref 101–111)
Creatinine, Ser: 0.56 mg/dL (ref 0.44–1.00)
Glucose, Bld: 160 mg/dL — ABNORMAL HIGH (ref 65–99)
POTASSIUM: 3.7 mmol/L (ref 3.5–5.1)
Sodium: 139 mmol/L (ref 135–145)

## 2016-07-10 LAB — CBC
HEMATOCRIT: 38.9 % (ref 35.0–47.0)
Hemoglobin: 13.5 g/dL (ref 12.0–16.0)
MCH: 30.7 pg (ref 26.0–34.0)
MCHC: 34.9 g/dL (ref 32.0–36.0)
MCV: 88 fL (ref 80.0–100.0)
PLATELETS: 237 10*3/uL (ref 150–440)
RBC: 4.41 MIL/uL (ref 3.80–5.20)
RDW: 12.7 % (ref 11.5–14.5)
WBC: 10.4 10*3/uL (ref 3.6–11.0)

## 2016-07-10 LAB — GLUCOSE, CAPILLARY
Glucose-Capillary: 143 mg/dL — ABNORMAL HIGH (ref 65–99)
Glucose-Capillary: 149 mg/dL — ABNORMAL HIGH (ref 65–99)
Glucose-Capillary: 159 mg/dL — ABNORMAL HIGH (ref 65–99)

## 2016-07-10 MED ORDER — NAPROXEN SODIUM 220 MG PO TABS: 220.0000 mg | ORAL_TABLET | Freq: Every day | ORAL | 0 refills | Status: DC

## 2016-07-10 MED ORDER — TRAMADOL HCL 50 MG PO TABS: 50.0000 mg | ORAL_TABLET | ORAL | 0 refills | Status: DC | PRN

## 2016-07-10 MED ORDER — LACTULOSE 10 GM/15ML PO SOLN: 10.0000 g | Freq: Two times a day (BID) | ORAL | Status: DC | PRN

## 2016-07-10 MED ORDER — OXYCODONE HCL 5 MG PO TABS: 5.0000 mg | ORAL_TABLET | ORAL | 0 refills | Status: DC | PRN

## 2016-07-10 MED ORDER — ENOXAPARIN SODIUM 30 MG/0.3ML ~~LOC~~ SOLN: 40.0000 mg | SUBCUTANEOUS | 0 refills | Status: DC

## 2016-07-10 MED ORDER — ASPIRIN 81 MG PO TABS: 81.0000 mg | ORAL_TABLET | Freq: Every day | ORAL | 0 refills | Status: AC

## 2016-07-10 NOTE — Progress Notes (Signed)
Patient stated she had a good night sleep.  Pain is controlled with tylenol prn.  Up to chair this am.  Used bone foam to surgical leg at HS.  No signs of distress.  VSS

## 2016-07-10 NOTE — Progress Notes (Signed)
Shift assessment completed. Pt is oob to recliner with legs elevated, PA has removed hemovac on rounds. Pt is alert and oriented, in no distress, rating pain at 0. tp is on room air, lungs clear bilat, Hr is regular, abdomen is soft, bs heard. Pt is oob ot bathroom with walker to void prn , l knee has honeycomb dressing and polar care intact, ppp, no edema noted. piv #20 intact to L hand, site is free of redness and swelling. Call bell in reach.

## 2016-07-10 NOTE — Progress Notes (Signed)
This Clinical research associatewriter removed pt's piv from l hand with catheter intact, pt tolerated well. Pt  Received d/c instructions with husband present and verbally indicated she understood all material. Pt also received scripts for pain medication and asa. Pt was escorted to front entrance of facility via wc to waiting family vehicle.

## 2016-07-10 NOTE — Plan of Care (Signed)
Problem: Skin Integrity: Goal: Signs of wound healing will improve Outcome: Completed/Met Date Met: 07/10/16 Pt has met all goals for discharge.

## 2016-07-10 NOTE — Care Management Note (Signed)
Case Management Note  Patient Details  Name: Kim Hancock MRN: [id number] Date of Birth: 15-Nov-1955  Subjective/Objective:   Met with patient at beside to discuss discharge planning. PT recommending OP PT. TC to [place] and received appointment for Friday June 8 at 11 am. Place on DC instructions and updated patient. She will need a walker and bsc. Ordered from [facility]. Pharmacy:[facility] [address] [phone]. Called Lovenox 40 mg # 14 no refills. PCP Kathrine Haddock.                   Action/Plan: [facility] for bsc and walker. OP PT at [facility]. Lovenox called in  Expected Discharge Date:  07/10/16               Expected Discharge Plan:  OP Rehab  In-House Referral:     Discharge planning Services  CM Consult  Post Acute Care Choice:  Durable Medical Equipment Choice offered to:  Patient  DME Arranged:  Bedside commode, Walker rolling DME Agency:  [place]:    [facility] Agency:     Status of Service:  Completed, signed off  If discussed at [employer] of Stay Meetings, dates discussed:    Additional Comments:  Jolly Mango, RN 07/10/2016, 9:18 AM

## 2016-07-10 NOTE — Progress Notes (Signed)
Physical Therapy Treatment Patient Details Name: Kim Hancock MRN: [id number] DOB: Jan 12, 1956 Today's Date: 07/10/2016    History of Present Illness 61yo female pt s/p L TKA. PMHx significant for HTN, HLD, osteopenia, OA, hx MI, DM, hypothyroidism.      PT Comments    Pt agreeable to PT; 2/10 pain left knee, 0 at rest. Pt with Mod I for transfers and supervision for ambulation. Quality of ambulation fluid and steady with speed below baseline, but safe and steady. Pt educated on set up and use of new equipment with all set up provided of rolling walker and 3 in 1. Pt educated on stretching and isometric strengthening with good progression of range (0-88). Pt prepared to discharge home and continue rehab efforts with outpatient PT.   Follow Up Recommendations  Outpatient PT     Equipment Recommendations  Rolling walker with 5" wheels    Recommendations for Other Services       Precautions / Restrictions Precautions Precautions: Knee Restrictions Weight Bearing Restrictions: Yes LLE Weight Bearing: Weight bearing as tolerated    Mobility  Bed Mobility                  Transfers Overall transfer level: Modified independent Equipment used: Rolling walker (2 wheeled) Transfers: Sit to/from Stand Sit to Stand: Modified independent (Device/Increase time)         General transfer comment: Performs well; no issues  Ambulation/Gait Ambulation/Gait assistance: Supervision Ambulation Distance (Feet): 120 Feet Assistive device: Rolling walker (2 wheeled) Gait Pattern/deviations: Step-through pattern Gait velocity: reduced Gait velocity interpretation: Below normal speed for age/gender General Gait Details: Limited reliance on rw. Mostly fluid with decreased speed as expected at this time.    Stairs            Wheelchair Mobility    Modified Rankin (Stroke Patients Only)       Balance Overall balance assessment: Modified Independent                                          Cognition Arousal/Alertness: Awake/alert Behavior During Therapy: WFL for tasks assessed/performed Overall Cognitive Status: Within Functional Limits for tasks assessed                                        Exercises Total Joint Exercises Quad Sets: Strengthening;Both;20 reps (also in stand) Knee Flexion: AROM;Left;10 reps;Seated (4 positions each rep with 10 sec hold ea) Goniometric ROM: 0-88 Other Exercises Other Exercises: educated in equipment set up and use and new equipment set up provided. Also educated in positioning, cryotherapy and expected exercise/activity level with progression regarding technique, reps, durantion and frequency    General Comments        Pertinent Vitals/Pain Pain Assessment: 0-10 Pain Score: 2  Pain Location: L knee with movement/exercise only Pain Intervention(s): Monitored during session;Premedicated before session;Ice applied    Home Living                      Prior Function            PT Goals (current goals can now be found in the care plan section) Progress towards PT goals: Goals met/education completed, patient discharged from PT    Frequency    BID  PT Plan Current plan remains appropriate    Co-evaluation              AM-PAC PT "6 Clicks" Daily Activity  Outcome Measure  Difficulty turning over in bed (including adjusting bedclothes, sheets and blankets)?: None Difficulty moving from lying on back to sitting on the side of the bed? : None Difficulty sitting down on and standing up from a chair with arms (e.g., wheelchair, bedside commode, etc,.)?: None Help needed moving to and from a bed to chair (including a wheelchair)?: None Help needed walking in hospital room?: None Help needed climbing 3-5 steps with a railing? : A Little 6 Click Score: 23    End of Session   Activity Tolerance: Patient tolerated treatment well Patient left: in  chair;Other (comment) (polar care in place)   PT Visit Diagnosis: Muscle weakness (generalized) (M62.81);Difficulty in walking, not elsewhere classified (R26.2)     Time: 7322-5672 PT Time Calculation (min) (ACUTE ONLY): 42 min  Charges:  $Gait Training: 8-22 mins $Therapeutic Exercise: 23-37 mins                    G Codes:        Larae Grooms, PTA 07/10/2016, 12:28 PM

## 2016-07-10 NOTE — Progress Notes (Signed)
   Subjective: 2 Days Post-Op Procedure(s) (LRB): COMPUTER ASSISTED TOTAL KNEE ARTHROPLASTY (Left) Patient reports pain as 2 on 0-10 scale.   Patient is well, and has had no acute complaints or problems Patient did very well with therapy yesterday. Still needs to do the lap around the nurse's desk Plan is to go Home after hospital stay. no nausea and no vomiting Patient denies any chest pains or shortness of breath. Objective: Vital signs in last 24 hours: Temp:  [97.6 F (36.4 C)-98.8 F (37.1 C)] 98.8 F (37.1 C) (06/06 0015) Pulse Rate:  [44-60] 60 (06/06 0015) Resp:  [16-18] 18 (06/06 0015) BP: (92-104)/(58-77) 104/58 (06/06 0015) SpO2:  [93 %-97 %] 94 % (06/06 0015) Weight:  [79.4 kg (175 lb)] 79.4 kg (175 lb) (06/05 0900) well approximated incision Heels are non tender and elevated off the bed using rolled towels Intake/Output from previous day: 06/05 0701 - 06/06 0700 In: 3170 [P.O.:1680; I.V.:1490] Out: -  Intake/Output this shift: Total I/O In: 720 [P.O.:720] Out: -    Recent Labs  07/09/16 0340 07/10/16 0428  HGB 13.4 13.5    Recent Labs  07/09/16 0340 07/10/16 0428  WBC 13.9* 10.4  RBC 4.49 4.41  HCT 39.4 38.9  PLT 263 237    Recent Labs  07/09/16 0340 07/10/16 0428  NA 136 139  K 4.1 3.7  CL 104 103  CO2 25 27  BUN 21* 18  CREATININE 0.73 0.56  GLUCOSE 144* 160*  CALCIUM 8.5* 8.6*   No results for input(s): LABPT, INR in the last 72 hours.  EXAM General - Patient is Alert, Appropriate and Oriented Extremity - Neurologically intact Neurovascular intact Sensation intact distally Intact pulses distally Dorsiflexion/Plantar flexion intact No cellulitis present Compartment soft Dressing - dressing C/D/I Motor Function - intact, moving foot and toes well on exam.    Past Medical History:  Diagnosis Date  . Diabetes mellitus without complication (HCC)   . Environmental and seasonal allergies   . Hyperlipidemia   . Hypertension    . Hypothyroidism   . Myocardial infarction (HCC)   . Obesity   . Osteoarthritis   . Osteopenia     Assessment/Plan: 2 Days Post-Op Procedure(s) (LRB): COMPUTER ASSISTED TOTAL KNEE ARTHROPLASTY (Left) Active Problems:   S/P total knee arthroplasty  Estimated body mass index is 30.04 kg/m as calculated from the following:   Height as of this encounter: 5\' 4"  (1.626 m).   Weight as of this encounter: 79.4 kg (175 lb). Up with therapy Discharge home with home health  Labs: Were reviewed. DVT Prophylaxis - Lovenox, Foot Pumps and TED hose Weight-Bearing as tolerated to left leg Patient needs a bowel movement. Hemovac discontinued on today's visit. Lactulose ordered. Please wash the operative leg and apply TED stockings to the left leg prior to patient being discharged  Euan Wandler R. [facility] PA [facility] 07/10/2016, 6:49 AM

## 2016-07-11 LAB — GLUCOSE, CAPILLARY: Glucose-Capillary: 166 mg/dL — ABNORMAL HIGH (ref 65–99)

## 2016-07-26 ENCOUNTER — Telehealth: Payer: Self-pay | Admitting: Internal Medicine

## 2016-07-26 NOTE — Telephone Encounter (Signed)
Attempted to schedule sooner new patient appt from waitlist on 7/25 with dr end.  Patient declined as this is the first week of school.

## 2016-08-08 ENCOUNTER — Encounter: Payer: Self-pay | Admitting: Cardiovascular Disease

## 2016-08-08 ENCOUNTER — Ambulatory Visit (INDEPENDENT_AMBULATORY_CARE_PROVIDER_SITE_OTHER): Payer: BC Managed Care – PPO | Admitting: Cardiovascular Disease

## 2016-08-08 VITALS — BP 142/90 | HR 86 | Ht 64.0 in | Wt 171.5 lb

## 2016-08-08 DIAGNOSIS — E785 Hyperlipidemia, unspecified: Secondary | ICD-10-CM | POA: Diagnosis not present

## 2016-08-08 DIAGNOSIS — I10 Essential (primary) hypertension: Secondary | ICD-10-CM

## 2016-08-08 DIAGNOSIS — R9431 Abnormal electrocardiogram [ECG] [EKG]: Secondary | ICD-10-CM

## 2016-08-08 DIAGNOSIS — R0602 Shortness of breath: Secondary | ICD-10-CM | POA: Diagnosis not present

## 2016-08-08 NOTE — Patient Instructions (Addendum)
Medication Instructions:  Your physician recommends that you continue on your current medications as directed. Please refer to the Current Medication list given to you today.   Labwork: none  Testing/Procedures: Your physician has requested that you have a lexiscan myoview. For further information please visit [email or web address] Please follow instruction sheet, as given.  [facility] MYOVIEW  Your caregiver has ordered a Stress Test with nuclear imaging. The purpose of this test is to evaluate the blood supply to your heart muscle. This procedure is referred to as a "Non-Invasive Stress Test." This is because other than having an IV started in your vein, nothing is inserted or "invades" your body. Cardiac stress tests are done to find areas of poor blood flow to the heart by determining the extent of coronary artery disease (CAD). Some patients exercise on a treadmill, which naturally increases the blood flow to your heart, while others who are  unable to walk on a treadmill due to physical limitations have a pharmacologic/chemical stress agent called Lexiscan . This medicine will mimic walking on a treadmill by temporarily increasing your coronary blood flow.   Please note: these test may take anywhere between 2-4 hours to complete  PLEASE REPORT TO [facility] MEDICAL MALL ENTRANCE  THE VOLUNTEERS AT THE FIRST DESK WILL DIRECT YOU WHERE TO GO  Date of Procedure: Thursday, July 19  Arrival Time for Procedure:  7:15am  Instructions regarding medication:   __xx__ : Hold diabetes medication morning of procedure   xx:  Hold other medications as follows: Do not take losartan-HCTZ the morning of your test  PLEASE NOTIFY THE OFFICE AT LEAST 24 HOURS IN ADVANCE IF YOU ARE UNABLE TO KEEP YOUR APPOINTMENT.  [phone] AND  PLEASE NOTIFY NUCLEAR MEDICINE AT [facility] AT LEAST 24 HOURS IN ADVANCE IF YOU ARE UNABLE TO KEEP YOUR APPOINTMENT. [phone]  How to prepare for your Myoview test:  1. Do not eat  or drink after midnight 2. No caffeine for 24 hours prior to test 3. No smoking 24 hours prior to test. 4. Your medication may be taken with water.  If your doctor stopped a medication because of this test, do not take that medication. 5. Ladies, please do not wear dresses.  Skirts or pants are appropriate. Please wear a short sleeve shirt. 6. No perfume, cologne or lotion. 7. Wear comfortable walking shoes. No heels!            Follow-Up: Your physician recommends that you schedule a follow-up appointment as needed.    Any Other Special Instructions Will Be Listed Below (If Applicable).     If you need a refill on your cardiac medications before your next appointment, please call your pharmacy.  Cardiac Nuclear Scan A cardiac nuclear scan is a test that measures blood flow to the heart when a person is resting and when he or she is exercising. The test looks for problems such as:  Not enough blood reaching a portion of the heart.  The heart muscle not working normally.  You may need this test if:  You have heart disease.  You have had abnormal lab results.  You have had heart surgery or angioplasty.  You have chest pain.  You have shortness of breath.  In this test, a radioactive dye (tracer) is injected into your bloodstream. After the tracer has traveled to your heart, an imaging device is used to measure how much of the tracer is absorbed by or distributed to various areas of your heart. This procedure  is usually done at a hospital and takes 2-4 hours. Tell a health care provider about:  Any allergies you have.  All medicines you are taking, including vitamins, herbs, eye drops, creams, and over-the-counter medicines.  Any problems you or family members have had with the use of anesthetic medicines.  Any blood disorders you have.  Any surgeries you have had.  Any medical conditions you have.  Whether you are pregnant or may be pregnant. What are the  risks? Generally, this is a safe procedure. However, problems may occur, including:  Serious chest pain and heart attack. This is only a risk if the stress portion of the test is done.  Rapid heartbeat.  Sensation of warmth in your chest. This usually passes quickly.  What happens before the procedure?  Ask your health care provider about changing or stopping your regular medicines. This is especially important if you are taking diabetes medicines or blood thinners.  Remove your jewelry on the day of the procedure. What happens during the procedure?  An IV tube will be inserted into one of your veins.  Your health care provider will inject a small amount of radioactive tracer through the tube.  You will wait for 20-40 minutes while the tracer travels through your bloodstream.  Your heart activity will be monitored with an electrocardiogram (ECG).  You will lie down on an exam table.  Images of your heart will be taken for about 15-20 minutes.  You may be asked to exercise on a treadmill or stationary bike. While you exercise, your heart's activity will be monitored with an ECG, and your blood pressure will be checked. If you are unable to exercise, you may be given a medicine to increase blood flow to parts of your heart.  When blood flow to your heart has peaked, a tracer will again be injected through the IV tube.  After 20-40 minutes, you will get back on the exam table and have more images taken of your heart.  When the procedure is over, your IV tube will be removed. The procedure may vary among health care providers and hospitals. Depending on the type of tracer used, scans may need to be repeated 3-4 hours later. What happens after the procedure?  Unless your health care provider tells you otherwise, you may return to your normal schedule, including diet, activities, and medicines.  Unless your health care provider tells you otherwise, you may increase your fluid  intake. This will help flush the contrast dye from your body. Drink enough fluid to keep your urine clear or pale yellow.  It is up to you to get your test results. Ask your health care provider, or the department that is doing the test, when your results will be ready. Summary  A cardiac nuclear scan measures the blood flow to the heart when a person is resting and when he or she is exercising.  You may need this test if you are at risk for heart disease.  Tell your health care provider if you are pregnant.  Unless your health care provider tells you otherwise, increase your fluid intake. This will help flush the contrast dye from your body. Drink enough fluid to keep your urine clear or pale yellow. This information is not intended to replace advice given to you by your health care provider. Make sure you discuss any questions you have with your health care provider. Document Released: 02/16/2004 Document Revised: 01/24/2016 Document Reviewed: 12/30/2012 Elsevier Interactive Patient Education  2017  [employer].

## 2016-08-08 NOTE — Progress Notes (Signed)
Cardiology Office Note   Date:  08/08/2016   ID:  Kim, Hancock 18-Apr-1955, MRN [id number]  PCP:  Gabriel Cirri, NP  Cardiologist:   Lorine Bears, MD   Chief Complaint  Patient presents with  . other    referred by [facility] for abnormal EKG. Pt states she is doing well; denies cp/sob. Reviewed meds with pt verbally.      History of Present Illness: Kim Hancock is a 61 y.o. female whom I'm seeing at the request of Gabriel Cirri for evaluation of an abnormal EKG. The patient has no prior cardiac history. She has known history of type 2 diabetes, hypertension, hyperlipidemia, hypothyroidism and osteoarthritis. She recently had a routine EKG performed before a routine left knee replacement. The EKG was abnormal and was suggestive of possible old anterior and inferior infarct. The patient is not aware of any previous history of myocardial infarction or ischemic heart disease. She denies any chest pain and reports stable exertional dyspnea with no recent worsening. No previous cardiac testing. No orthopnea, PND or significant leg edema. Before her knee replacement, she was walking 3 miles per day with no significant symptoms. She is not a smoker and there is no family history of premature coronary artery disease.    Past Medical History:  Diagnosis Date  . Diabetes mellitus without complication (HCC)   . Environmental and seasonal allergies   . Hyperlipidemia   . Hypertension   . Hypothyroidism   . Myocardial infarction (HCC)   . Obesity   . Osteoarthritis   . Osteopenia     Past Surgical History:  Procedure Laterality Date  . ABDOMINAL HYSTERECTOMY    . CESAREAN SECTION    . DILATION AND CURETTAGE OF UTERUS    . ECTOPIC PREGNANCY SURGERY    . KNEE ARTHROPLASTY Left 07/08/2016   Procedure: COMPUTER ASSISTED TOTAL KNEE ARTHROPLASTY;  Surgeon: Donato Heinz, MD;  Location: [facility] ORS;  Service: Orthopedics;  Laterality: Left;  . LAPAROSCOPY    . TOOTH EXTRACTION        Current Outpatient Prescriptions  Medication Sig Dispense Refill  . aspirin 81 MG tablet Take 1 tablet (81 mg total) by mouth daily. 1 tablet 0  . b complex vitamins tablet Take 1 tablet by mouth daily.    . Calcium-Vitamin D-Vitamin K (CALCIUM + D + K PO) Take 1 tablet by mouth daily.    . Coenzyme Q10 200 MG capsule Take 200 mg by mouth at bedtime.     . empagliflozin (JARDIANCE) 25 MG TABS tablet Take 25 mg by mouth daily. 90 tablet 1  . fexofenadine (ALLEGRA) 180 MG tablet Take 180 mg by mouth daily.     . Glucosamine-Chondroitin 750-600 MG TABS Take 1 tablet by mouth 2 (two) times daily.     Marland Kitchen levothyroxine (SYNTHROID, LEVOTHROID) 100 MCG tablet Take 1 tablet (100 mcg total) by mouth daily. (Patient taking differently: Take 100 mcg by mouth daily before breakfast. ) 90 tablet 3  . losartan-hydrochlorothiazide (HYZAAR) 100-12.5 MG tablet Take 1 tablet by mouth daily. 90 tablet 3  . metFORMIN (GLUCOPHAGE) 500 MG tablet TAKE 1 TABLET BY MOUTH TWICE DAILY WITH A MEAL 180 tablet 0  . Multiple Vitamin (MULTIVITAMIN WITH MINERALS) TABS tablet Take 1 tablet by mouth daily. Women's 50+    . naproxen sodium (ANAPROX) 220 MG tablet Take 1 tablet (220 mg total) by mouth daily. 1 tablet 0  . Omega-3 Fatty Acids (FISH OIL) 1200 MG CAPS  Take 1,200 mg by mouth 2 (two) times daily.    . Omega-3 Fatty Acids (RA FISH OIL) 900 MG CAPS Take 900 mg by mouth 2 (two) times daily.    . rosuvastatin (CRESTOR) 20 MG tablet TAKE 1 TABLET(20 MG) BY MOUTH DAILY (Patient taking differently: TAKE 1 TABLET(20 MG) BY MOUTH N THE EVENING) 90 tablet 0  . thiamine (VITAMIN B-1) 100 MG tablet Take 100 mg by mouth daily.     No current facility-administered medications for this visit.     Allergies:   Penicillins and Latex    Social History:  The patient  reports that she has never smoked. She has never used smokeless tobacco. She reports that she does not drink alcohol or use drugs.   Family History:  The  patient's family history includes Alzheimer's disease in her mother; Arthritis in her father; Breast cancer in her paternal aunt; Heart disease in her father; Hyperlipidemia in her father; Stroke in her mother; Thyroid disease in her paternal grandmother, sister, sister, and sister.    ROS:  Please see the history of present illness.   Otherwise, review of systems are positive for none.   All other systems are reviewed and negative.    PHYSICAL EXAM: VS:  BP (!) 142/90 (BP Location: Right Arm, Patient Position: Sitting, Cuff Size: Normal)   Pulse 86   Ht 5\' 4"  (1.626 m)   Wt 171 lb 8 oz (77.8 kg)   LMP  (LMP Unknown)   BMI 29.44 kg/m  , BMI Body mass index is 29.44 kg/m. GEN: Well nourished, well developed, in no acute distress  HEENT: normal  Neck: no JVD, carotid bruits, or masses Cardiac: RRR; no murmurs, rubs, or gallops,no edema  Respiratory:  clear to auscultation bilaterally, normal work of breathing GI: soft, nontender, nondistended, + BS MS: no deformity or atrophy  Skin: warm and dry, no rash Neuro:  Strength and sensation are intact Psych: euthymic mood, full affect   EKG:  EKG is ordered today. The ekg ordered today demonstrates normal sinus rhythm with minimal LVH, possible old inferior infarct and poor R-wave progression in the anterior leads suggestive of prior anterior infarct.   Recent Labs: 02/19/2016: TSH 1.840 06/26/2016: ALT 21 07/10/2016: BUN 18; Creatinine, Ser 0.56; Hemoglobin 13.5; Platelets 237; Potassium 3.7; Sodium 139    Lipid Panel    Component Value Date/Time   CHOL 166 02/19/2016 0823   CHOL WILL FOLLOW 06/05/2015 1357   TRIG 166 (H) 02/19/2016 0823   TRIG WILL FOLLOW 06/05/2015 1357   HDL 50 02/19/2016 0823   VLDL WILL FOLLOW 06/05/2015 1357   LDLCALC 83 02/19/2016 0823      Wt Readings from Last 3 Encounters:  08/08/16 171 lb 8 oz (77.8 kg)  07/09/16 175 lb (79.4 kg)  07/03/16 177 lb 3.2 oz (80.4 kg)        PAD Screen 08/08/2016   Previous PAD dx? No  Previous surgical procedure? No  Pain with walking? No  Feet/toe relief with dangling? No  Painful, non-healing ulcers? No  Extremities discolored? No      ASSESSMENT AND PLAN:  1.  Abnormal EKG suggestive of possible prior infarcts in a patient with multiple risk factors for coronary artery disease including diabetes mellitus: No prior clinical history of myocardial infarction but given history of diabetes, she is certainly at risk of silent ischemia. Some of her EKG changes might be related to hypertensive heart disease. Thus, I requested a pharmacologic nuclear stress  test for evaluation. She is not able to exercise on a treadmill due to recent left knee replacement. If stress testing is low risk, I recommend continuing treatment of risk factors.  2. Essential hypertension: Blood pressure is mildly elevated .continue to monitor and consider adjusting her medications if needed.  3. Hyperlipidemia: Currently on rosuvastatin. Recommend a target LDL of less than 70 due to diabetes.    Disposition:   FU with me as needed.   Signed,  Lorine Bears, MD  08/08/2016 2:11 PM    [facility]

## 2016-08-16 ENCOUNTER — Other Ambulatory Visit: Payer: Self-pay | Admitting: Unknown Physician Specialty

## 2016-08-22 ENCOUNTER — Ambulatory Visit
Admission: RE | Admit: 2016-08-22 | Discharge: 2016-08-22 | Disposition: A | Discharge status: Home / Self Care | Payer: BC Managed Care – PPO | Source: Ambulatory Visit | Attending: Cardiovascular Disease | Admitting: Cardiovascular Disease

## 2016-08-22 DIAGNOSIS — R9431 Abnormal electrocardiogram [ECG] [EKG]: Secondary | ICD-10-CM | POA: Diagnosis present

## 2016-08-22 DIAGNOSIS — R0602 Shortness of breath: Secondary | ICD-10-CM | POA: Insufficient documentation

## 2016-08-22 LAB — NM MYOCAR MULTI W/SPECT W/WALL MOTION / EF
CHL CUP NUCLEAR SDS: 2
CHL CUP RESTING HR STRESS: 69 {beats}/min
CSEPPHR: 101 {beats}/min
LVDIAVOL: 69 mL (ref 46–106)
LVSYSVOL: 21 mL
NUC STRESS TID: 0.81
Percent HR: 64 %
SRS: 9
SSS: 10

## 2016-08-22 MED ORDER — REGADENOSON 0.4 MG/5ML IV SOLN
0.4000 mg | Freq: Once | INTRAVENOUS | Status: AC
  Administered 2016-08-22: 0.4 mg via INTRAVENOUS

## 2016-08-22 MED ORDER — TECHNETIUM TC 99M TETROFOSMIN IV KIT
13.0000 | PACK | Freq: Once | INTRAVENOUS | Status: AC | PRN
  Administered 2016-08-22: 13.32 via INTRAVENOUS

## 2016-08-22 MED ORDER — TECHNETIUM TC 99M TETROFOSMIN IV KIT
32.5230 | PACK | Freq: Once | INTRAVENOUS | Status: AC | PRN
  Administered 2016-08-22: 32.523 via INTRAVENOUS

## 2016-09-04 ENCOUNTER — Ambulatory Visit: Payer: BC Managed Care – PPO | Admitting: Internal Medicine

## 2016-09-25 ENCOUNTER — Encounter: Payer: Self-pay | Admitting: Unknown Physician Specialty

## 2016-09-25 ENCOUNTER — Ambulatory Visit (INDEPENDENT_AMBULATORY_CARE_PROVIDER_SITE_OTHER): Payer: BC Managed Care – PPO | Admitting: Unknown Physician Specialty

## 2016-09-25 VITALS — BP 132/89 | HR 77 | Temp 98.5°F | Wt 168.6 lb

## 2016-09-25 DIAGNOSIS — E119 Type 2 diabetes mellitus without complications: Secondary | ICD-10-CM | POA: Diagnosis not present

## 2016-09-25 DIAGNOSIS — E7800 Pure hypercholesterolemia, unspecified: Secondary | ICD-10-CM

## 2016-09-25 DIAGNOSIS — I10 Essential (primary) hypertension: Secondary | ICD-10-CM | POA: Diagnosis not present

## 2016-09-25 NOTE — Assessment & Plan Note (Signed)
Stable, continue present medications.   

## 2016-09-25 NOTE — Progress Notes (Signed)
   BP 132/89 (BP Location: Left Arm, Cuff Size: Normal)   Pulse 77   Temp 98.5 F (36.9 C)   Wt 168 lb 9.6 oz (76.5 kg)   LMP  (LMP Unknown)   SpO2 98%   BMI 28.94 kg/m    Subjective:    Patient ID: Kim Hancock, female    DOB: 1955/12/13, 61 y.o.   MRN: [id number]  HPI: Kim Hancock is a 61 y.o. female  Chief Complaint  Patient presents with  . Diabetes  . Hyperlipidemia  . Hypertension   Diabetes: Using medications without difficulties. Pt had multiple steroid injection and the last was over 6 months ago No hypoglycemic episodes No hyperglycemic episodes Feet problems:none Blood Sugars averaging: 130-135 in the AM eye exam within last year Last Hgb A1C: 7.4  Hypertension  Using medications without difficulty Average home BPs About the same as here   Using medication without problems or lightheadedness No chest pain with exertion or shortness of breath No Edema  Elevated Cholesterol Using medications without problems No Muscle aches  Diet: Lost a few pounds.   Exercise:Recent knee replacement  Relevant past medical, surgical, family and social history reviewed and updated as indicated. Interim medical history since our last visit reviewed. Allergies and medications reviewed and updated.  Review of Systems  Per HPI unless specifically indicated above     Objective:    BP 132/89 (BP Location: Left Arm, Cuff Size: Normal)   Pulse 77   Temp 98.5 F (36.9 C)   Wt 168 lb 9.6 oz (76.5 kg)   LMP  (LMP Unknown)   SpO2 98%   BMI 28.94 kg/m   Wt Readings from Last 3 Encounters:  09/25/16 168 lb 9.6 oz (76.5 kg)  08/08/16 171 lb 8 oz (77.8 kg)  07/09/16 175 lb (79.4 kg)    Physical Exam  Constitutional: She is oriented to person, place, and time. She appears well-developed and well-nourished. No distress.  HENT:  Head: Normocephalic and atraumatic.  Eyes: Conjunctivae and lids are normal. Right eye exhibits no discharge. Left eye exhibits no discharge.  No scleral icterus.  Neck: Normal range of motion. Neck supple. No JVD present. Carotid bruit is not present.  Cardiovascular: Normal rate, regular rhythm and normal heart sounds.   Pulmonary/Chest: Effort normal and breath sounds normal.  Abdominal: Normal appearance. There is no splenomegaly or hepatomegaly.  Musculoskeletal: Normal range of motion.  Neurological: She is alert and oriented to person, place, and time.  Skin: Skin is warm, dry and intact. No rash noted. No pallor.  Psychiatric: She has a normal mood and affect. Her behavior is normal. Judgment and thought content normal.     Assessment & Plan:   Problem List Items Addressed This Visit      Unprioritized   Diabetes mellitus (HCC) - Primary    Hgb A1C is 7.2 which is improved from 7.4.  Will continue lifestyle changes.  Recheck in 3 months      Relevant Orders   Comprehensive metabolic panel   Bayer DCA Hb J4G Waived   Hypercholesterolemia    Stable, continue present medications.        Hypertension    Stable, continue present medications.            Follow up plan: Return in about 3 months (around 12/26/2016).

## 2016-09-25 NOTE — Assessment & Plan Note (Signed)
Hgb A1C is 7.2 which is improved from 7.4.  Will continue lifestyle changes.  Recheck in 3 months

## 2016-09-26 LAB — COMPREHENSIVE METABOLIC PANEL
ALK PHOS: 57 IU/L (ref 39–117)
ALT: 16 IU/L (ref 0–32)
AST: 11 IU/L (ref 0–40)
Albumin/Globulin Ratio: 1.9 (ref 1.2–2.2)
Albumin: 4.4 g/dL (ref 3.6–4.8)
BILIRUBIN TOTAL: 0.4 mg/dL (ref 0.0–1.2)
BUN/Creatinine Ratio: 36 — ABNORMAL HIGH (ref 12–28)
BUN: 25 mg/dL (ref 8–27)
CHLORIDE: 99 mmol/L (ref 96–106)
CO2: 23 mmol/L (ref 20–29)
Calcium: 9.6 mg/dL (ref 8.7–10.3)
Creatinine, Ser: 0.69 mg/dL (ref 0.57–1.00)
GFR calc non Af Amer: 93 mL/min/{1.73_m2} (ref >59)
GFR, EST AFRICAN AMERICAN: 107 mL/min/{1.73_m2} (ref >59)
GLUCOSE: 145 mg/dL — AB (ref 65–99)
Globulin, Total: 2.3 g/dL (ref 1.5–4.5)
POTASSIUM: 3.5 mmol/L (ref 3.5–5.2)
Sodium: 144 mmol/L (ref 134–144)
TOTAL PROTEIN: 6.7 g/dL (ref 6.0–8.5)

## 2016-09-26 LAB — BAYER DCA HB A1C WAIVED: HB A1C: 7.2 % — AB (ref <7.0)

## 2016-09-26 NOTE — Progress Notes (Signed)
Normal labs.  Pt notified through mychart

## 2016-11-18 ENCOUNTER — Other Ambulatory Visit: Payer: Self-pay | Admitting: Unknown Physician Specialty

## 2016-12-31 ENCOUNTER — Encounter: Payer: Self-pay | Admitting: Unknown Physician Specialty

## 2016-12-31 ENCOUNTER — Ambulatory Visit: Payer: BC Managed Care – PPO | Admitting: Unknown Physician Specialty

## 2016-12-31 VITALS — BP 128/83 | HR 72 | Temp 98.4°F | Wt 168.8 lb

## 2016-12-31 DIAGNOSIS — E119 Type 2 diabetes mellitus without complications: Secondary | ICD-10-CM | POA: Diagnosis not present

## 2016-12-31 DIAGNOSIS — R5383 Other fatigue: Secondary | ICD-10-CM | POA: Diagnosis not present

## 2016-12-31 DIAGNOSIS — I10 Essential (primary) hypertension: Secondary | ICD-10-CM | POA: Diagnosis not present

## 2016-12-31 DIAGNOSIS — E039 Hypothyroidism, unspecified: Secondary | ICD-10-CM

## 2016-12-31 NOTE — Assessment & Plan Note (Signed)
Check TSH.  Change medications as appropriate

## 2016-12-31 NOTE — Progress Notes (Signed)
BP 128/83   Pulse 72   Temp 98.4 F (36.9 C) (Oral)   Wt 168 lb 12.8 oz (76.6 kg)   LMP  (LMP Unknown)   SpO2 99%   BMI 28.97 kg/m    Subjective:    Patient ID: Kim Hancock, female    DOB: [birth year], 61 y.o.   MRN: [id number]  HPI: Kim GuthrieJean M Treml is a 61 y.o. female  Chief Complaint  Patient presents with  . Diabetes  . Hyperlipidemia  . Hypertension  . Hypothyroidism   Diabetes: Using medications without difficulties No hypoglycemic episodes No hyperglycemic episodes Feet problems: none Blood Sugars averaging: 130-135 eye exam within last year Last Hgb A1C: 7.2  Hypertension  Using medications without difficulty Average home BPs    Using medication without problems or lightheadedness No chest pain with exertion or shortness of breath No Edema  Elevated Cholesterol Using medications without problems No Muscle aches  Diet: Exercise: Not hungry a lot and eating a lot of fruit and vegetables.  Alternating leg muscles or walking aor recumbant  Hypothyroid Weight is the same but low in energy and sleeps 12 hours   Relevant past medical, surgical, family and social history reviewed and updated as indicated. Interim medical history since our last visit reviewed. Allergies and medications reviewed and updated.  Review of Systems  Constitutional: Negative.   HENT: Negative.   Eyes: Negative.   Respiratory: Negative.   Cardiovascular: Negative.   Gastrointestinal: Negative.   Endocrine: Negative.   Genitourinary: Negative.   Musculoskeletal: Negative.   Skin: Negative.   Allergic/Immunologic: Negative.   Neurological: Negative.   Hematological: Negative.   Psychiatric/Behavioral: Negative.     Per HPI unless specifically indicated above     Objective:    BP 128/83   Pulse 72   Temp 98.4 F (36.9 C) (Oral)   Wt 168 lb 12.8 oz (76.6 kg)   LMP  (LMP Unknown)   SpO2 99%   BMI 28.97 kg/m   Wt Readings from Last 3 Encounters:  12/31/16 168 lb  12.8 oz (76.6 kg)  09/25/16 168 lb 9.6 oz (76.5 kg)  08/08/16 171 lb 8 oz (77.8 kg)    Physical Exam  Constitutional: She is oriented to person, place, and time. She appears well-developed and well-nourished. No distress.  HENT:  Head: Normocephalic and atraumatic.  Eyes: Conjunctivae and lids are normal. Right eye exhibits no discharge. Left eye exhibits no discharge. No scleral icterus.  Neck: Normal range of motion. Neck supple. No JVD present. Carotid bruit is not present.  Cardiovascular: Normal rate, regular rhythm and normal heart sounds.  Pulmonary/Chest: Effort normal and breath sounds normal.  Abdominal: Normal appearance. There is no splenomegaly or hepatomegaly.  Musculoskeletal: Normal range of motion.  Neurological: She is alert and oriented to person, place, and time.  Skin: Skin is warm, dry and intact. No rash noted. No pallor.  Psychiatric: She has a normal mood and affect. Her behavior is normal. Judgment and thought content normal.    Results for orders placed or performed in visit on 09/25/16  Comprehensive metabolic panel  Result Value Ref Range   Glucose 145 (H) 65 - 99 mg/dL   BUN 25 8 - 27 mg/dL   Creatinine, Ser 2.540.69 0.57 - 1.00 mg/dL   GFR calc non Af Amer 93 >59 mL/min/1.73   GFR calc Af Amer 107 >59 mL/min/1.73   BUN/Creatinine Ratio 36 (H) 12 - 28   Sodium 144 134 - 144  mmol/L   Potassium 3.5 3.5 - 5.2 mmol/L   Chloride 99 96 - 106 mmol/L   CO2 23 20 - 29 mmol/L   Calcium 9.6 8.7 - 10.3 mg/dL   Total Protein 6.7 6.0 - 8.5 g/dL   Albumin 4.4 3.6 - 4.8 g/dL   Globulin, Total 2.3 1.5 - 4.5 g/dL   Albumin/Globulin Ratio 1.9 1.2 - 2.2   Bilirubin Total 0.4 0.0 - 1.2 mg/dL   Alkaline Phosphatase 57 39 - 117 IU/L   AST 11 0 - 40 IU/L   ALT 16 0 - 32 IU/L  Bayer DCA Hb A1c Waived  Result Value Ref Range   Bayer DCA Hb A1c Waived 7.2 (H) <7.0 %      Assessment & Plan:   Problem List Items Addressed This Visit      Unprioritized   Diabetes  mellitus (HCC) - Primary    Hgb A1C is pending.  Will follow results and make changes as appropriate.        Relevant Orders   Hgb A1c w/o eAG   Hypertension    Stable, continue present medications.        Relevant Orders   Comprehensive metabolic panel   Hypothyroidism    Check TSH.  Change medications as appropriate      Relevant Orders   TSH    Other Visit Diagnoses    Fatigue, unspecified type       Relevant Orders   CBC with Differential/Platelet       Follow up plan: Return in about 3 months (around 04/02/2017).

## 2016-12-31 NOTE — Assessment & Plan Note (Signed)
Hgb A1C is pending.  Will follow results and make changes as appropriate.

## 2016-12-31 NOTE — Assessment & Plan Note (Signed)
Stable, continue present medications.   

## 2017-01-01 LAB — COMPREHENSIVE METABOLIC PANEL
A/G RATIO: 2.4 — AB (ref 1.2–2.2)
ALT: 17 IU/L (ref 0–32)
AST: 15 IU/L (ref 0–40)
Albumin: 4.4 g/dL (ref 3.6–4.8)
Alkaline Phosphatase: 54 IU/L (ref 39–117)
BUN/Creatinine Ratio: 38 — ABNORMAL HIGH (ref 12–28)
BUN: 28 mg/dL — ABNORMAL HIGH (ref 8–27)
Bilirubin Total: 0.5 mg/dL (ref 0.0–1.2)
CALCIUM: 9.8 mg/dL (ref 8.7–10.3)
CO2: 25 mmol/L (ref 20–29)
CREATININE: 0.73 mg/dL (ref 0.57–1.00)
Chloride: 102 mmol/L (ref 96–106)
GFR, EST AFRICAN AMERICAN: 101 mL/min/{1.73_m2} (ref >59)
GFR, EST NON AFRICAN AMERICAN: 88 mL/min/{1.73_m2} (ref >59)
GLOBULIN, TOTAL: 1.8 g/dL (ref 1.5–4.5)
Glucose: 127 mg/dL — ABNORMAL HIGH (ref 65–99)
Potassium: 3.7 mmol/L (ref 3.5–5.2)
Sodium: 144 mmol/L (ref 134–144)
TOTAL PROTEIN: 6.2 g/dL (ref 6.0–8.5)

## 2017-01-01 LAB — CBC WITH DIFFERENTIAL/PLATELET
BASOS: 0 %
Basophils Absolute: 0 10*3/uL (ref 0.0–0.2)
EOS (ABSOLUTE): 0.3 10*3/uL (ref 0.0–0.4)
Eos: 4 %
HEMOGLOBIN: 14.9 g/dL (ref 11.1–15.9)
Hematocrit: 44 % (ref 34.0–46.6)
IMMATURE GRANS (ABS): 0 10*3/uL (ref 0.0–0.1)
IMMATURE GRANULOCYTES: 0 %
LYMPHS: 28 %
Lymphocytes Absolute: 2.1 10*3/uL (ref 0.7–3.1)
MCH: 30.2 pg (ref 26.6–33.0)
MCHC: 33.9 g/dL (ref 31.5–35.7)
MCV: 89 fL (ref 79–97)
MONOCYTES: 10 %
Monocytes Absolute: 0.7 10*3/uL (ref 0.1–0.9)
NEUTROS ABS: 4.4 10*3/uL (ref 1.4–7.0)
NEUTROS PCT: 58 %
PLATELETS: 324 10*3/uL (ref 150–379)
RBC: 4.94 x10E6/uL (ref 3.77–5.28)
RDW: 13.5 % (ref 12.3–15.4)
WBC: 7.5 10*3/uL (ref 3.4–10.8)

## 2017-01-01 LAB — TSH: TSH: 2.41 u[IU]/mL (ref 0.450–4.500)

## 2017-01-01 LAB — HGB A1C W/O EAG: Hgb A1c MFr Bld: 7.3 % — ABNORMAL HIGH (ref 4.8–5.6)

## 2017-01-01 NOTE — Progress Notes (Signed)
Notified pt by mychart

## 2017-02-19 ENCOUNTER — Other Ambulatory Visit: Payer: Self-pay | Admitting: Unknown Physician Specialty

## 2017-04-02 ENCOUNTER — Encounter: Payer: Self-pay | Admitting: Unknown Physician Specialty

## 2017-04-02 ENCOUNTER — Ambulatory Visit: Payer: BC Managed Care – PPO | Admitting: Unknown Physician Specialty

## 2017-04-02 VITALS — BP 129/83 | HR 60 | Temp 98.4°F | Ht 64.0 in | Wt 173.2 lb

## 2017-04-02 DIAGNOSIS — I10 Essential (primary) hypertension: Secondary | ICD-10-CM | POA: Diagnosis not present

## 2017-04-02 DIAGNOSIS — E119 Type 2 diabetes mellitus without complications: Secondary | ICD-10-CM | POA: Diagnosis not present

## 2017-04-02 DIAGNOSIS — E7800 Pure hypercholesterolemia, unspecified: Secondary | ICD-10-CM

## 2017-04-02 LAB — BAYER DCA HB A1C WAIVED: HB A1C (BAYER DCA - WAIVED): 7 % — ABNORMAL HIGH (ref <7.0)

## 2017-04-02 NOTE — Assessment & Plan Note (Signed)
Hgb A1C is 7.0%  This is better.  Pt will continue to work on lifestyle

## 2017-04-02 NOTE — Assessment & Plan Note (Signed)
On Crestor.  Check lipid panel

## 2017-04-02 NOTE — Progress Notes (Signed)
BP 129/83   Pulse 60   Temp 98.4 F (36.9 C) (Oral)   Ht 5\' 4"  (1.626 m)   Wt 173 lb 3.2 oz (78.6 kg)   LMP  (LMP Unknown)   SpO2 98%   BMI 29.73 kg/m    Subjective:    Patient ID: Kim Hancock, female    DOB: Nov 23, 1955, 62 y.o.   MRN: [id number]  HPI: Kim Hancock is a 62 y.o. female  Chief Complaint  Patient presents with  . Diabetes  . Hyperlipidemia  . Hypertension   Diabetes: Using medications without difficulties No hypoglycemic episodes No hyperglycemic episodes Feet problems: none Blood Sugars averaging:130-140 in the AM eye exam within last year Last Hgb A1C: 7.3  Hypertension  Using medications without difficulty Average home BPs    Using medication without problems or lightheadedness No chest pain with exertion or shortness of breath No Edema  Elevated Cholesterol Using medications without problems No Muscle aches  Diet: Exercise: Working out on her bike and doing leg exercises.     Relevant past medical, surgical, family and social history reviewed and updated as indicated. Interim medical history since our last visit reviewed. Allergies and medications reviewed and updated.  Review of Systems  Constitutional: Negative.   HENT: Negative.   Eyes: Negative.   Respiratory: Negative.   Cardiovascular: Negative.   Gastrointestinal: Negative.   Endocrine: Negative.   Genitourinary: Negative.   Musculoskeletal: Negative.   Skin: Negative.   Allergic/Immunologic: Negative.   Neurological: Negative.   Hematological: Negative.   Psychiatric/Behavioral: Negative.     Per HPI unless specifically indicated above     Objective:    BP 129/83   Pulse 60   Temp 98.4 F (36.9 C) (Oral)   Ht 5\' 4"  (1.626 m)   Wt 173 lb 3.2 oz (78.6 kg)   LMP  (LMP Unknown)   SpO2 98%   BMI 29.73 kg/m   Wt Readings from Last 3 Encounters:  04/02/17 173 lb 3.2 oz (78.6 kg)  12/31/16 168 lb 12.8 oz (76.6 kg)  09/25/16 168 lb 9.6 oz (76.5 kg)      Physical Exam  Constitutional: She is oriented to person, place, and time. She appears well-developed and well-nourished. No distress.  HENT:  Head: Normocephalic and atraumatic.  Eyes: Conjunctivae and lids are normal. Right eye exhibits no discharge. Left eye exhibits no discharge. No scleral icterus.  Neck: Normal range of motion. Neck supple. No JVD present. Carotid bruit is not present.  Cardiovascular: Normal rate, regular rhythm and normal heart sounds.  Pulmonary/Chest: Effort normal and breath sounds normal.  Abdominal: Normal appearance. There is no splenomegaly or hepatomegaly.  Musculoskeletal: Normal range of motion.  Neurological: She is alert and oriented to person, place, and time.  Skin: Skin is warm, dry and intact. No rash noted. No pallor.  Psychiatric: She has a normal mood and affect. Her behavior is normal. Judgment and thought content normal.     Assessment & Plan:   Problem List Items Addressed This Visit      Unprioritized   Diabetes mellitus (HCC) - Primary    Hgb A1C is 7.0%  This is better.  Pt will continue to work on lifestyle      Relevant Orders   Bayer DCA Hb A1c Waived   Comprehensive metabolic panel   Hypercholesterolemia    On Crestor.  Check lipid panel      Relevant Orders   Lipid Panel w/o Chol/HDL Ratio  Hypertension    Stable, continue present medications.        Relevant Orders   Comprehensive metabolic panel       Follow up plan: Return in about 3 months (around 06/30/2017).

## 2017-04-02 NOTE — Assessment & Plan Note (Signed)
Stable, continue present medications.   

## 2017-04-03 LAB — COMPREHENSIVE METABOLIC PANEL
A/G RATIO: 1.8 (ref 1.2–2.2)
ALBUMIN: 4.4 g/dL (ref 3.6–4.8)
ALK PHOS: 56 IU/L (ref 39–117)
ALT: 18 IU/L (ref 0–32)
AST: 14 IU/L (ref 0–40)
BILIRUBIN TOTAL: 0.4 mg/dL (ref 0.0–1.2)
BUN / CREAT RATIO: 37 — AB (ref 12–28)
BUN: 24 mg/dL (ref 8–27)
CO2: 25 mmol/L (ref 20–29)
CREATININE: 0.65 mg/dL (ref 0.57–1.00)
Calcium: 9.6 mg/dL (ref 8.7–10.3)
Chloride: 100 mmol/L (ref 96–106)
GFR calc Af Amer: 109 mL/min/{1.73_m2} (ref >59)
GFR calc non Af Amer: 95 mL/min/{1.73_m2} (ref >59)
GLOBULIN, TOTAL: 2.4 g/dL (ref 1.5–4.5)
Glucose: 143 mg/dL — ABNORMAL HIGH (ref 65–99)
POTASSIUM: 3.9 mmol/L (ref 3.5–5.2)
SODIUM: 142 mmol/L (ref 134–144)
Total Protein: 6.8 g/dL (ref 6.0–8.5)

## 2017-04-03 LAB — LIPID PANEL W/O CHOL/HDL RATIO
CHOLESTEROL TOTAL: 166 mg/dL (ref 100–199)
HDL: 54 mg/dL (ref >39)
LDL Calculated: 70 mg/dL (ref 0–99)
TRIGLYCERIDES: 208 mg/dL — AB (ref 0–149)
VLDL CHOLESTEROL CAL: 42 mg/dL — AB (ref 5–40)

## 2017-04-03 NOTE — Progress Notes (Signed)
Notified pt by mychart

## 2017-05-09 ENCOUNTER — Other Ambulatory Visit: Payer: Self-pay | Admitting: Unknown Physician Specialty

## 2017-07-01 ENCOUNTER — Ambulatory Visit: Payer: BC Managed Care – PPO | Admitting: Unknown Physician Specialty

## 2017-07-01 ENCOUNTER — Encounter: Payer: Self-pay | Admitting: Unknown Physician Specialty

## 2017-07-01 VITALS — BP 125/79 | HR 62 | Temp 98.1°F | Ht 64.0 in | Wt 171.8 lb

## 2017-07-01 DIAGNOSIS — E7800 Pure hypercholesterolemia, unspecified: Secondary | ICD-10-CM

## 2017-07-01 DIAGNOSIS — E119 Type 2 diabetes mellitus without complications: Secondary | ICD-10-CM

## 2017-07-01 DIAGNOSIS — I10 Essential (primary) hypertension: Secondary | ICD-10-CM

## 2017-07-01 LAB — BAYER DCA HB A1C WAIVED: HB A1C: 7 % — AB (ref <7.0)

## 2017-07-01 NOTE — Patient Instructions (Signed)
GLP 1 class- Ozempic, Bydureon, Trulicity

## 2017-07-01 NOTE — Assessment & Plan Note (Signed)
Hgb A1C is 7.0.  Similar to last number.  Discussed adding a GLP 1.  She would like to hold off for now.  This is reasonable as Hgb A1C just above goal.  Encouraged exercise

## 2017-07-01 NOTE — Assessment & Plan Note (Signed)
Stable, continue present medications.   

## 2017-07-01 NOTE — Progress Notes (Signed)
BP 125/79   Pulse 62   Temp 98.1 F (36.7 C) (Oral)   Ht  (1.626 m)   Wt 171 lb 12.8 oz (77.9 kg)   LMP  (LMP Unknown)   SpO2 98%   BMI 29.49 kg/m    Subjective:    Patient ID: Kim Hancock, female    DOB: 1955-07-10, 62 y.o.   MRN: [id number]  HPI: Kim TOUCHETTE is a 62 y.o. female  Chief Complaint  Patient presents with  . Diabetes  . Hyperlipidemia  . Hypertension   Diabetes: Using medications without difficulties No hypoglycemic episodes No hyperglycemic episodes Feet problems: none Blood Sugars averaging:140-150 in the AM eye exam within last year Last Hgb A1C: 7.0  Hypertension  Using medications without difficulty Average home BPs About what they are today   Using medication without problems or lightheadedness No chest pain with exertion or shortness of breath No Edema  Elevated Cholesterol Using medications without problems No Muscle aches  Diet: Exercise: "holding my own"  Relevant past medical, surgical, family and social history reviewed and updated as indicated. Interim medical history since our last visit reviewed. Allergies and medications reviewed and updated.  Review of Systems  Per HPI unless specifically indicated above     Objective:    BP 125/79   Pulse 62   Temp 98.1 F (36.7 C) (Oral)   Ht  (1.626 m)   Wt 171 lb 12.8 oz (77.9 kg)   LMP  (LMP Unknown)   SpO2 98%   BMI 29.49 kg/m   Wt Readings from Last 3 Encounters:  07/01/17 171 lb 12.8 oz (77.9 kg)  04/02/17 173 lb 3.2 oz (78.6 kg)  12/31/16 168 lb 12.8 oz (76.6 kg)    Physical Exam  Constitutional: She is oriented to person, place, and time. She appears well-developed and well-nourished.  HENT:  Head: Normocephalic and atraumatic.  Eyes: Pupils are equal, round, and reactive to light. Right eye exhibits no discharge. Left eye exhibits no discharge. No scleral icterus.  Neck: Normal range of motion. Neck supple. Carotid bruit is not present. No  thyromegaly present.  Cardiovascular: Normal rate, regular rhythm and normal heart sounds. Exam reveals no gallop and no friction rub.  No murmur heard. Pulmonary/Chest: Effort normal and breath sounds normal. No respiratory distress. She has no wheezes. She has no rales. No breast tenderness or discharge.  Abdominal: Soft. Bowel sounds are normal. There is no tenderness. There is no rebound.  Genitourinary: Vagina normal and uterus normal. No breast tenderness or discharge. Cervix exhibits no motion tenderness, no discharge and no friability. Right adnexum displays no mass, no tenderness and no fullness. Left adnexum displays no mass, no tenderness and no fullness.  Musculoskeletal: Normal range of motion.  Lymphadenopathy:    She has no cervical adenopathy.  Neurological: She is alert and oriented to person, place, and time.  Skin: Skin is warm, dry and intact. No rash noted.  Psychiatric: She has a normal mood and affect. Her speech is normal and behavior is normal. Judgment and thought content normal. Cognition and memory are normal.    Results for orders placed or performed in visit on 04/02/17  Lipid Panel w/o Chol/HDL Ratio  Result Value Ref Range   Cholesterol, Total 166 100 - 199 mg/dL   Triglycerides 782 (H) 0 - 149 mg/dL   HDL 54 >95 mg/dL   VLDL Cholesterol Cal 42 (H) 5 - 40 mg/dL   LDL Calculated 70  0 - 99 mg/dL  Bayer DCA Hb W0J Waived  Result Value Ref Range   HB A1C (BAYER DCA - WAIVED) 7.0 (H) <7.0 %  Comprehensive metabolic panel  Result Value Ref Range   Glucose 143 (H) 65 - 99 mg/dL   BUN 24 8 - 27 mg/dL   Creatinine, Ser 8.11 0.57 - 1.00 mg/dL   GFR calc non Af Amer 95 >59 mL/min/1.73   GFR calc Af Amer 109 >59 mL/min/1.73   BUN/Creatinine Ratio 37 (H) 12 - 28   Sodium 142 134 - 144 mmol/L   Potassium 3.9 3.5 - 5.2 mmol/L   Chloride 100 96 - 106 mmol/L   CO2 25 20 - 29 mmol/L   Calcium 9.6 8.7 - 10.3 mg/dL   Total Protein 6.8 6.0 - 8.5 g/dL   Albumin 4.4  3.6 - 4.8 g/dL   Globulin, Total 2.4 1.5 - 4.5 g/dL   Albumin/Globulin Ratio 1.8 1.2 - 2.2   Bilirubin Total 0.4 0.0 - 1.2 mg/dL   Alkaline Phosphatase 56 39 - 117 IU/L   AST 14 0 - 40 IU/L   ALT 18 0 - 32 IU/L      Assessment & Plan:   Problem List Items Addressed This Visit      Unprioritized   Diabetes mellitus (HCC) - Primary    Hgb A1C is 7.0.  Similar to last number.  Discussed adding a GLP 1.  She would like to hold off for now.  This is reasonable as Hgb A1C just above goal.  Encouraged exercise      Relevant Orders   Comprehensive metabolic panel   Bayer DCA Hb B1Y Waived   Hypercholesterolemia    Stable, continue present medications.        Hypertension    Stable, continue present medications.            Follow up plan: Return in about 3 months (around 10/01/2017).

## 2017-07-02 LAB — COMPREHENSIVE METABOLIC PANEL
ALT: 18 IU/L (ref 0–32)
AST: 18 IU/L (ref 0–40)
Albumin/Globulin Ratio: 2.1 (ref 1.2–2.2)
Albumin: 4.6 g/dL (ref 3.6–4.8)
Alkaline Phosphatase: 57 IU/L (ref 39–117)
BILIRUBIN TOTAL: 0.5 mg/dL (ref 0.0–1.2)
BUN/Creatinine Ratio: 36 — ABNORMAL HIGH (ref 12–28)
BUN: 24 mg/dL (ref 8–27)
CHLORIDE: 100 mmol/L (ref 96–106)
CO2: 23 mmol/L (ref 20–29)
CREATININE: 0.66 mg/dL (ref 0.57–1.00)
Calcium: 9.4 mg/dL (ref 8.7–10.3)
GFR calc non Af Amer: 94 mL/min/{1.73_m2} (ref >59)
GFR, EST AFRICAN AMERICAN: 108 mL/min/{1.73_m2} (ref >59)
GLUCOSE: 116 mg/dL — AB (ref 65–99)
Globulin, Total: 2.2 g/dL (ref 1.5–4.5)
Potassium: 3.5 mmol/L (ref 3.5–5.2)
Sodium: 142 mmol/L (ref 134–144)
TOTAL PROTEIN: 6.8 g/dL (ref 6.0–8.5)

## 2017-07-02 NOTE — Progress Notes (Signed)
Notified pt by mychart

## 2017-08-05 ENCOUNTER — Other Ambulatory Visit: Payer: Self-pay | Admitting: Unknown Physician Specialty

## 2017-08-05 LAB — HM DIABETES EYE EXAM

## 2017-08-05 NOTE — Telephone Encounter (Signed)
Refill requests: Metformin; last refill 05/12/17; #180, no refills  Rosuvastatin; last refill 05/12/17; #90; no refills  Losartan; last refill 05/12/17; # 90; no refills  Levothyroxine; last refill 05/12/17; no refills; last TSH 12/31/16; nl.   Jardiance; last refill 05/12/17; # 90; no refills  PCP Gabriel Cirriheryl Wicker  Last OV 07/01/17  Pharm[facility]: [facility] [address] [address] [place]

## 2017-10-09 ENCOUNTER — Ambulatory Visit: Payer: BC Managed Care – PPO | Admitting: Family Medicine

## 2017-11-03 ENCOUNTER — Other Ambulatory Visit: Payer: Self-pay | Admitting: Unknown Physician Specialty

## 2017-11-04 NOTE — Telephone Encounter (Signed)
Requested Prescriptions  Pending Prescriptions Disp Refills  . levothyroxine (SYNTHROID, LEVOTHROID) 100 MCG tablet [Pharmacy Med Name: LEVOTHYROXINE 0.100MG (100MCG) TAB] 90 tablet 0    Sig: TAKE 1 TABLET(100 MCG) BY MOUTH DAILY     Endocrinology:  Hypothyroid Agents Failed - 11/04/2017 10:09 AM      Failed - TSH needs to be rechecked within 3 months after an abnormal result. Refill until TSH is due.      Passed - TSH in normal range and within 360 days    TSH  Date Value Ref Range Status  12/31/2016 2.410 0.450 - 4.500 uIU/mL Final         Passed - Valid encounter within last 12 months    Recent Outpatient Visits          4 months ago Type 2 diabetes mellitus without complication, without long-term current use of insulin ([place])   [facility] Kathrine Haddock, NP   7 months ago Type 2 diabetes mellitus without complication, without long-term current use of insulin ([place])   [facility] Kathrine Haddock, NP   10 months ago Type 2 diabetes mellitus without complication, without long-term current use of insulin ([place])   [facility] Kathrine Haddock, NP   1 year ago Type 2 diabetes mellitus without complication, without long-term current use of insulin ([place])   [place] Kathrine Haddock, NP   1 year ago Pre-operative clearance   [facility] Kathrine Haddock, NP      Future Appointments            In 3 days Orene Desanctis, Lilia Argue, [place], [facility]         . JARDIANCE 25 MG TABS tablet [Pharmacy Med Name: JARDIANCE 25MG TABLETS] 90 tablet 0    Sig: TAKE 1 TABLET BY MOUTH DAILY     Endocrinology:  Diabetes - SGLT2 Inhibitors Passed - 11/04/2017 10:09 AM      Passed - Cr in normal range and within 360 days    Creatinine, Ser  Date Value Ref Range Status  07/01/2017 0.66 0.57 - 1.00 mg/dL Final         Passed - LDL in normal range and within 360 days    LDL Calculated  Date Value Ref Range Status   04/02/2017 70 0 - 99 mg/dL Final         Passed - HBA1C is between 0 and 7.9 and within 180 days    Hemoglobin A1C  Date Value Ref Range Status  11/01/2015 6.8%  Final   Hgb A1c MFr Bld  Date Value Ref Range Status  12/31/2016 7.3 (H) 4.8 - 5.6 % Final    Comment:             Prediabetes: 5.7 - 6.4          Diabetes: >6.4          Glycemic control for adults with diabetes: <7.0          Passed - eGFR in normal range and within 360 days    GFR calc Af Amer  Date Value Ref Range Status  07/01/2017 108 >59 mL/min/1.73 Final   GFR calc non Af Amer  Date Value Ref Range Status  07/01/2017 94 >59 mL/min/1.73 Final         Passed - Valid encounter within last 6 months    Recent Outpatient Visits          4 months ago Type 2  diabetes mellitus without complication, without long-term current use of insulin ([place])   [facility] Kathrine Haddock, NP   7 months ago Type 2 diabetes mellitus without complication, without long-term current use of insulin ([place])   [facility] Kathrine Haddock, NP   10 months ago Type 2 diabetes mellitus without complication, without long-term current use of insulin ([place])   [place] Kathrine Haddock, NP   1 year ago Type 2 diabetes mellitus without complication, without long-term current use of insulin ([place])   [place] Kathrine Haddock, NP   1 year ago Pre-operative clearance   [facility] Kathrine Haddock, NP      Future Appointments            In 3 days Orene Desanctis, Lilia Argue, PA-C [place], [facility]         . metFORMIN (GLUCOPHAGE) 500 MG tablet [Pharmacy Med Name: METFORMIN 500MG TABLETS] 180 tablet 0    Sig: TAKE 1 TABLET BY MOUTH TWICE DAILY WITH A MEAL     Endocrinology:  Diabetes - Biguanides Passed - 11/04/2017 10:09 AM      Passed - Cr in normal range and within 360 days    Creatinine, Ser  Date Value Ref Range Status  07/01/2017 0.66 0.57 - 1.00 mg/dL Final          Passed - HBA1C is between 0 and 7.9 and within 180 days    Hemoglobin A1C  Date Value Ref Range Status  11/01/2015 6.8%  Final   Hgb A1c MFr Bld  Date Value Ref Range Status  12/31/2016 7.3 (H) 4.8 - 5.6 % Final    Comment:             Prediabetes: 5.7 - 6.4          Diabetes: >6.4          Glycemic control for adults with diabetes: <7.0          Passed - eGFR in normal range and within 360 days    GFR calc Af Amer  Date Value Ref Range Status  07/01/2017 108 >59 mL/min/1.73 Final   GFR calc non Af Amer  Date Value Ref Range Status  07/01/2017 94 >59 mL/min/1.73 Final         Passed - Valid encounter within last 6 months    Recent Outpatient Visits          4 months ago Type 2 diabetes mellitus without complication, without long-term current use of insulin ([place])   [facility] Kathrine Haddock, NP   7 months ago Type 2 diabetes mellitus without complication, without long-term current use of insulin ([place])   [facility] Kathrine Haddock, NP   10 months ago Type 2 diabetes mellitus without complication, without long-term current use of insulin ([place])   [place] Kathrine Haddock, NP   1 year ago Type 2 diabetes mellitus without complication, without long-term current use of insulin ([place])   [place] Kathrine Haddock, NP   1 year ago Pre-operative clearance   [facility] Kathrine Haddock, NP      Future Appointments            In 3 days Orene Desanctis, Lilia Argue, PA-C [place], [facility]         . losartan-hydrochlorothiazide (HYZAAR) 100-12.5 MG tablet [Pharmacy Med Name: LOSARTAN/HCTZ 100/12.5MG TABLETS] 90 tablet 0    Sig: TAKE 1 TABLET BY MOUTH DAILY  Cardiovascular: ARB + Diuretic Combos Passed - 11/04/2017 10:09 AM      Passed - K in normal range and within 180 days    Potassium  Date Value Ref Range Status  07/01/2017 3.5 3.5 - 5.2 mmol/L Final         Passed - Na in normal range and  within 180 days    Sodium  Date Value Ref Range Status  07/01/2017 142 134 - 144 mmol/L Final         Passed - Cr in normal range and within 180 days    Creatinine, Ser  Date Value Ref Range Status  07/01/2017 0.66 0.57 - 1.00 mg/dL Final         Passed - Ca in normal range and within 180 days    Calcium  Date Value Ref Range Status  07/01/2017 9.4 8.7 - 10.3 mg/dL Final         Passed - Patient is not pregnant      Passed - Last BP in normal range    BP Readings from Last 1 Encounters:  07/01/17 125/79         Passed - Valid encounter within last 6 months    Recent Outpatient Visits          4 months ago Type 2 diabetes mellitus without complication, without long-term current use of insulin ([place])   [facility] Kathrine Haddock, NP   7 months ago Type 2 diabetes mellitus without complication, without long-term current use of insulin ([place])   [facility] Kathrine Haddock, NP   10 months ago Type 2 diabetes mellitus without complication, without long-term current use of insulin ([place])   [facility] Kathrine Haddock, NP   1 year ago Type 2 diabetes mellitus without complication, without long-term current use of insulin ([place])   [place] Kathrine Haddock, NP   1 year ago Pre-operative clearance   [facility] Kathrine Haddock, NP      Future Appointments            In 3 days Orene Desanctis, Lilia Argue, PA-C [facility], [facility]         . rosuvastatin (CRESTOR) 20 MG tablet [Pharmacy Med Name: ROSUVASTATIN 20MG TABLETS] 90 tablet 2    Sig: TAKE 1 TABLET(20 MG) BY MOUTH DAILY     Cardiovascular:  Antilipid - Statins Failed - 11/04/2017 10:09 AM      Failed - Triglycerides in normal range and within 360 days    Triglycerides  Date Value Ref Range Status  04/02/2017 208 (H) 0 - 149 mg/dL Final   Triglycerides Piccolo,Waived  Date Value Ref Range Status  06/05/2015 WILL FOLLOW  Preliminary         Passed -  Total Cholesterol in normal range and within 360 days    Cholesterol, Total  Date Value Ref Range Status  04/02/2017 166 100 - 199 mg/dL Final   Cholesterol Piccolo, Waived  Date Value Ref Range Status  06/05/2015 WILL FOLLOW  Preliminary         Passed - LDL in normal range and within 360 days    LDL Calculated  Date Value Ref Range Status  04/02/2017 70 0 - 99 mg/dL Final         Passed - HDL in normal range and within 360 days    HDL  Date Value Ref Range Status  04/02/2017 54 >39 mg/dL Final  Passed - Patient is not pregnant      Passed - Valid encounter within last 12 months    Recent Outpatient Visits          4 months ago Type 2 diabetes mellitus without complication, without long-term current use of insulin ([place])   [facility], Malachy Mood, NP   7 months ago Type 2 diabetes mellitus without complication, without long-term current use of insulin ([place])   [facility] Kathrine Haddock, NP   10 months ago Type 2 diabetes mellitus without complication, without long-term current use of insulin ([place])   [facility] Kathrine Haddock, NP   1 year ago Type 2 diabetes mellitus without complication, without long-term current use of insulin ([place])   [place] Kathrine Haddock, NP   1 year ago Pre-operative clearance   [facility] Kathrine Haddock, NP      Future Appointments            In 3 days Orene Desanctis, Lilia Argue, [place], [facility]

## 2017-11-07 ENCOUNTER — Encounter: Payer: Self-pay | Admitting: Family Medicine

## 2017-11-07 ENCOUNTER — Ambulatory Visit: Payer: BC Managed Care – PPO | Admitting: Family Medicine

## 2017-11-07 VITALS — BP 127/79 | HR 61 | Temp 97.8°F | Ht 64.0 in | Wt 168.5 lb

## 2017-11-07 DIAGNOSIS — E119 Type 2 diabetes mellitus without complications: Secondary | ICD-10-CM

## 2017-11-07 DIAGNOSIS — I10 Essential (primary) hypertension: Secondary | ICD-10-CM

## 2017-11-07 DIAGNOSIS — E039 Hypothyroidism, unspecified: Secondary | ICD-10-CM | POA: Diagnosis not present

## 2017-11-07 DIAGNOSIS — E7800 Pure hypercholesterolemia, unspecified: Secondary | ICD-10-CM | POA: Diagnosis not present

## 2017-11-07 NOTE — Progress Notes (Signed)
BP 127/79   Pulse 61   Temp 97.8 F (36.6 C) (Oral)   Ht 5\' 4"  (1.626 m)   Wt 168 lb 8 oz (76.4 kg)   LMP  (LMP Unknown)   SpO2 98%   BMI 28.92 kg/m    Subjective:    Patient ID: Kim Hancock, female    DOB: 03-07-1955, 62 y.o.   MRN: [id number]  HPI: Kim Hancock is a 62 y.o. female  Chief Complaint  Patient presents with  . Diabetes  . Hyperlipidemia  . Hypertension   Here today for 6 month f/u chronic conditions. Taking all medications faithfully without side effects.   DM - Getting 110-120 fasting readings at home. Exercising much more, diet is very good, low carb. Tolerating medicines well. No low blood sugar spells.   BPs WNL when checked. Denies Cp, SOB, HAs, syncope.   Taking crestor for cholesterol control without issues. No myalgias, claudication.   Taking synthroid 100 mcg for hypothyroid. Asymptomatic.   Past Medical History:  Diagnosis Date  . Diabetes mellitus without complication (HCC)   . Environmental and seasonal allergies   . Hyperlipidemia   . Hypertension   . Hypothyroidism   . Myocardial infarction (HCC)   . Obesity   . Osteoarthritis   . Osteopenia    Social History   Socioeconomic History  . Marital status: Married    Spouse name: Not on file  . Number of children: Not on file  . Years of education: Not on file  . Highest education level: Not on file  Occupational History  . Not on file  Social Needs  . Financial resource strain: Not on file  . Food insecurity:    Worry: Not on file    Inability: Not on file  . Transportation needs:    Medical: Not on file    Non-medical: Not on file  Tobacco Use  . Smoking status: Never Smoker  . Smokeless tobacco: Never Used  Substance and Sexual Activity  . Alcohol use: No    Alcohol/week: 0.0 standard drinks  . Drug use: No  . Sexual activity: Not Currently  Lifestyle  . Physical activity:    Days per week: Not on file    Minutes per session: Not on file  . Stress: Not on  file  Relationships  . Social connections:    Talks on phone: Not on file    Gets together: Not on file    Attends religious service: Not on file    Active member of club or organization: Not on file    Attends meetings of clubs or organizations: Not on file    Relationship status: Not on file  . Intimate partner violence:    Fear of current or ex partner: Not on file    Emotionally abused: Not on file    Physically abused: Not on file    Forced sexual activity: Not on file  Other Topics Concern  . Not on file  Social History Narrative  . Not on file    Relevant past medical, surgical, family and social history reviewed and updated as indicated. Interim medical history since our last visit reviewed. Allergies and medications reviewed and updated.  Review of Systems  Per HPI unless specifically indicated above     Objective:    BP 127/79   Pulse 61   Temp 97.8 F (36.6 C) (Oral)   Ht 5\' 4"  (1.626 m)   Wt 168 lb 8 oz (  76.4 kg)   LMP  (LMP Unknown)   SpO2 98%   BMI 28.92 kg/m   Wt Readings from Last 3 Encounters:  11/07/17 168 lb 8 oz (76.4 kg)  07/01/17 171 lb 12.8 oz (77.9 kg)  04/02/17 173 lb 3.2 oz (78.6 kg)    Physical Exam  Constitutional: She is oriented to person, place, and time. She appears well-developed and well-nourished. No distress.  HENT:  Head: Atraumatic.  Eyes: Conjunctivae and EOM are normal.  Neck: Normal range of motion. Neck supple.  Cardiovascular: Normal rate, regular rhythm and normal heart sounds.  Pulmonary/Chest: Effort normal and breath sounds normal.  Abdominal: Soft. Bowel sounds are normal.  Musculoskeletal: Normal range of motion. She exhibits no edema.  Neurological: She is alert and oriented to person, place, and time.  Skin: Skin is warm and dry.  Psychiatric: She has a normal mood and affect. Her behavior is normal.  Nursing note and vitals reviewed.   Results for orders placed or performed in visit on 11/07/17    Comprehensive metabolic panel  Result Value Ref Range   Glucose 126 (H) 65 - 99 mg/dL   BUN 21 8 - 27 mg/dL   Creatinine, Ser 1.61 0.57 - 1.00 mg/dL   GFR calc non Af Amer 93 >59 mL/min/1.73   GFR calc Af Amer 107 >59 mL/min/1.73   BUN/Creatinine Ratio 31 (H) 12 - 28   Sodium 141 134 - 144 mmol/L   Potassium 3.7 3.5 - 5.2 mmol/L   Chloride 99 96 - 106 mmol/L   CO2 24 20 - 29 mmol/L   Calcium 9.8 8.7 - 10.3 mg/dL   Total Protein 6.4 6.0 - 8.5 g/dL   Albumin 4.3 3.6 - 4.8 g/dL   Globulin, Total 2.1 1.5 - 4.5 g/dL   Albumin/Globulin Ratio 2.0 1.2 - 2.2   Bilirubin Total 0.4 0.0 - 1.2 mg/dL   Alkaline Phosphatase 50 39 - 117 IU/L   AST 15 0 - 40 IU/L   ALT 15 0 - 32 IU/L  Lipid Panel w/o Chol/HDL Ratio  Result Value Ref Range   Cholesterol, Total 137 100 - 199 mg/dL   Triglycerides 096 (H) 0 - 149 mg/dL   HDL 47 >04 mg/dL   VLDL Cholesterol Cal 32 5 - 40 mg/dL   LDL Calculated 58 0 - 99 mg/dL  TSH  Result Value Ref Range   TSH 1.740 0.450 - 4.500 uIU/mL  HgB A1c  Result Value Ref Range   Hgb A1c MFr Bld 7.1 (H) 4.8 - 5.6 %   Est. average glucose Bld gHb Est-mCnc 157 mg/dL      Assessment & Plan:   Problem List Items Addressed This Visit      Cardiovascular and Mediastinum   Hypertension - Primary    Stable and WNL, continue current regimen      Relevant Orders   Comprehensive metabolic panel (Completed)     Endocrine   Diabetes mellitus (HCC)    Check A1C, adjust as needed. Continue excellent lifestyle modifications and current regimen      Relevant Orders   HgB A1c (Completed)   Hypothyroidism    Recheck TSH, continue current regimen      Relevant Orders   TSH (Completed)     Other   Hypercholesterolemia    Stable, recheck lipids. Continue current regimen      Relevant Orders   Lipid Panel w/o Chol/HDL Ratio (Completed)       Follow up plan: Return  in about 6 months (around 05/09/2018) for CPE.

## 2017-11-07 NOTE — Assessment & Plan Note (Signed)
Stable and WNL, continue current regimen 

## 2017-11-08 LAB — COMPREHENSIVE METABOLIC PANEL
A/G RATIO: 2 (ref 1.2–2.2)
ALBUMIN: 4.3 g/dL (ref 3.6–4.8)
ALT: 15 IU/L (ref 0–32)
AST: 15 IU/L (ref 0–40)
Alkaline Phosphatase: 50 IU/L (ref 39–117)
BUN / CREAT RATIO: 31 — AB (ref 12–28)
BUN: 21 mg/dL (ref 8–27)
Bilirubin Total: 0.4 mg/dL (ref 0.0–1.2)
CALCIUM: 9.8 mg/dL (ref 8.7–10.3)
CO2: 24 mmol/L (ref 20–29)
Chloride: 99 mmol/L (ref 96–106)
Creatinine, Ser: 0.67 mg/dL (ref 0.57–1.00)
GFR, EST AFRICAN AMERICAN: 107 mL/min/{1.73_m2} (ref >59)
GFR, EST NON AFRICAN AMERICAN: 93 mL/min/{1.73_m2} (ref >59)
Globulin, Total: 2.1 g/dL (ref 1.5–4.5)
Glucose: 126 mg/dL — ABNORMAL HIGH (ref 65–99)
POTASSIUM: 3.7 mmol/L (ref 3.5–5.2)
Sodium: 141 mmol/L (ref 134–144)
TOTAL PROTEIN: 6.4 g/dL (ref 6.0–8.5)

## 2017-11-08 LAB — LIPID PANEL W/O CHOL/HDL RATIO
Cholesterol, Total: 137 mg/dL (ref 100–199)
HDL: 47 mg/dL (ref >39)
LDL Calculated: 58 mg/dL (ref 0–99)
Triglycerides: 159 mg/dL — ABNORMAL HIGH (ref 0–149)
VLDL CHOLESTEROL CAL: 32 mg/dL (ref 5–40)

## 2017-11-08 LAB — HEMOGLOBIN A1C
Est. average glucose Bld gHb Est-mCnc: 157 mg/dL
HEMOGLOBIN A1C: 7.1 % — AB (ref 4.8–5.6)

## 2017-11-08 LAB — TSH: TSH: 1.74 u[IU]/mL (ref 0.450–4.500)

## 2017-11-16 NOTE — Patient Instructions (Signed)
Follow up in 6 months 

## 2017-11-16 NOTE — Assessment & Plan Note (Signed)
Recheck TSH, continue current regimen 

## 2017-11-16 NOTE — Assessment & Plan Note (Signed)
Check A1C, adjust as needed. Continue excellent lifestyle modifications and current regimen

## 2017-11-16 NOTE — Assessment & Plan Note (Signed)
Stable, recheck lipids. Continue current regimen 

## 2018-01-11 ENCOUNTER — Other Ambulatory Visit: Payer: Self-pay | Admitting: Unknown Physician Specialty

## 2018-01-12 NOTE — Telephone Encounter (Signed)
Requested medication (s) are due for refill today: yes  Requested medication (s) are on the active medication list: yes  Last refill:  11/04/17 for 90 tabs  Future visit scheduled: yes  Notes to clinic:  Courtesy refill 1 added  Requested Prescriptions  Pending Prescriptions Disp Refills   losartan-hydrochlorothiazide (HYZAAR) 100-12.5 MG tablet [Pharmacy Med Name: LOSARTAN/HCTZ 100/12.5MG  TABLETS] 90 tablet 1    Sig: TAKE 1 TABLET BY MOUTH DAILY     Cardiovascular: ARB + Diuretic Combos Passed - 01/11/2018  7:40 AM      Passed - K in normal range and within 180 days    Potassium  Date Value Ref Range Status  11/07/2017 3.7 3.5 - 5.2 mmol/L Final         Passed - Na in normal range and within 180 days    Sodium  Date Value Ref Range Status  11/07/2017 141 134 - 144 mmol/L Final         Passed - Cr in normal range and within 180 days    Creatinine, Ser  Date Value Ref Range Status  11/07/2017 0.67 0.57 - 1.00 mg/dL Final         Passed - Ca in normal range and within 180 days    Calcium  Date Value Ref Range Status  11/07/2017 9.8 8.7 - 10.3 mg/dL Final         Passed - Patient is not pregnant      Passed - Last BP in normal range    BP Readings from Last 1 Encounters:  11/07/17 127/79         Passed - Valid encounter within last 6 months    Recent Outpatient Visits          2 months ago Essential hypertension   [facility] Particia NearingLane, Rachel Elizabeth, PA-C   6 months ago Type 2 diabetes mellitus without complication, without long-term current use of insulin (HCC)   [facility] Gabriel CirriWicker, Cheryl, NP   9 months ago Type 2 diabetes mellitus without complication, without long-term current use of insulin (HCC)   [facility] Cheryl[facility]   1 year ago Type 2 diabetes mellitus without complication, without long-term current use of insulin (HCC)   [facility] Gabriel CirriWicker, Cheryl, NP   1 year ago Type 2 diabetes mellitus  without complication, without long-term current use of insulin (HCC)   [facility] Gabriel CirriWicker, Cheryl, NP      Future Appointments            In 3 months Cannady, Dorie RankJolene T, NP [facility]

## 2018-01-12 NOTE — Telephone Encounter (Signed)
Requested Prescriptions  Pending Prescriptions Disp Refills  . losartan-hydrochlorothiazide (HYZAAR) 100-12.5 MG tablet [Pharmacy Med Name: LOSARTAN/HCTZ 100/12.5MG  TABLETS] 90 tablet 1    Sig: TAKE 1 TABLET BY MOUTH DAILY     Cardiovascular: ARB + Diuretic Combos Passed - 01/11/2018  7:40 AM      Passed - K in normal range and within 180 days    Potassium  Date Value Ref Range Status  11/07/2017 3.7 3.5 - 5.2 mmol/L Final         Passed - Na in normal range and within 180 days    Sodium  Date Value Ref Range Status  11/07/2017 141 134 - 144 mmol/L Final         Passed - Cr in normal range and within 180 days    Creatinine, Ser  Date Value Ref Range Status  11/07/2017 0.67 0.57 - 1.00 mg/dL Final         Passed - Ca in normal range and within 180 days    Calcium  Date Value Ref Range Status  11/07/2017 9.8 8.7 - 10.3 mg/dL Final         Passed - Patient is not pregnant      Passed - Last BP in normal range    BP Readings from Last 1 Encounters:  11/07/17 127/79         Passed - Valid encounter within last 6 months    Recent Outpatient Visits          2 months ago Essential hypertension   [facility] Part[facility] NearingLane, Rachel Elizabeth, PA-C   6 months ago Type 2 diabetes mellitus without complication, without long-term current use of insulin (HCC)   [facility] Cir[facility] Cheryl, NP   9 months ago Type 2 diabetes mellitus without complication, without long-term current use of insulin (HCC)   [facility] Cirri[facility] Cheryl, NP   1 year ago Type 2 diabetes mellitus without complication, without long-term current use of insulin (HCC)   [facility] Cirri[facility] Cheryl, NP   1 year ago Type 2 diabetes mellitus without complication, without long-term current use of insulin (HCC)   [facility] Gabriel CirriWicker, Cheryl, NP      Future Appointments            In 3 months Cannady, Dorie RankJolene T, NP [facility], [facility]

## 2018-02-01 ENCOUNTER — Other Ambulatory Visit: Payer: Self-pay | Admitting: Unknown Physician Specialty

## 2018-04-29 ENCOUNTER — Other Ambulatory Visit: Payer: Self-pay | Admitting: Unknown Physician Specialty

## 2018-05-11 ENCOUNTER — Telehealth: Payer: Self-pay | Admitting: Nurse Practitioner

## 2018-05-11 ENCOUNTER — Encounter: Payer: Self-pay | Admitting: Nurse Practitioner

## 2018-05-11 ENCOUNTER — Ambulatory Visit (INDEPENDENT_AMBULATORY_CARE_PROVIDER_SITE_OTHER): Payer: BC Managed Care – PPO | Admitting: Nurse Practitioner

## 2018-05-11 ENCOUNTER — Other Ambulatory Visit: Payer: Self-pay

## 2018-05-11 VITALS — BP 134/74 | HR 62 | Temp 98.2°F | Ht 64.5 in | Wt 162.0 lb

## 2018-05-11 DIAGNOSIS — E119 Type 2 diabetes mellitus without complications: Secondary | ICD-10-CM

## 2018-05-11 DIAGNOSIS — E039 Hypothyroidism, unspecified: Secondary | ICD-10-CM | POA: Diagnosis not present

## 2018-05-11 DIAGNOSIS — I10 Essential (primary) hypertension: Secondary | ICD-10-CM | POA: Diagnosis not present

## 2018-05-11 DIAGNOSIS — E7800 Pure hypercholesterolemia, unspecified: Secondary | ICD-10-CM | POA: Diagnosis not present

## 2018-05-11 NOTE — Progress Notes (Signed)
BP 134/74   Pulse 62   Temp 98.2 F (36.8 C) (Oral)   Ht 5' 4.5" (1.638 m)   Wt 162 lb (73.5 kg)   LMP  (LMP Unknown)   BMI 27.38 kg/m    Subjective:    Patient ID: Kim Hancock, female    DOB: 01-22-1956, 63 y.o.   MRN: [id number]  HPI: Kim Hancock is a 63 y.o. female  Chief Complaint  Patient presents with  . Hypertension    6 f/u  . Diabetes    . This visit was completed via telephone due to the restrictions of the COVID-19 pandemic. All issues as above were discussed and addressed but no physical exam was performed. If it was felt that the patient should be evaluated in the office, they were directed there. The patient verbally consented to this visit. Patient was unable to complete an audio/visual visit due to Technical difficulties,Lack of internet. Due to the catastrophic nature of the COVID-19 pandemic, this visit was done through audio contact only. . Location of the patient: home . Location of the provider: home . Those involved with this call:  . Provider: Aura DialsJolene Zekiah Caruth, DNP, AGPCNP-C . CMA: Elton SinAnita Quito, CMA . Front Desk/Registration: Adela Portshristan Williamson  . Time spent on call: 15 minutes on the phone discussing health concerns   HYPERTENSION / HYPERLIPIDEMIA Currently taking Losartan-HCTZ for HTN and Crestor + Fish Oil for HLD.  Last TCHOL 137 and LDL 58 with TRGI 159 on 11/07/17.   Satisfied with current treatment? yes Duration of hypertension: chronic BP monitoring frequency: weekly BP range:  BP medication side effects: no Duration of hyperlipidemia: chronic Cholesterol medication side effects: no Cholesterol supplements: fish oil Medication compliance: good compliance Aspirin: yes Recent stressors: no Recurrent headaches: no Visual changes: no Palpitations: no Dyspnea: no Chest pain: no Lower extremity edema: no Dizzy/lightheaded: no   DIABETES Currently on Metformin 500 MG BID and Jardiance 25 MG daily.  Last A1C 7.1 on 11/07/17.    Hypoglycemic episodes:no Polydipsia/polyuria: no Visual disturbance: no Chest pain: no Paresthesias: no Glucose Monitoring: yes  Accucheck frequency: Daily  Fasting glucose: 122 this morning, averages around 130 in morning  Post prandial:  Evening:  Before meals: Taking Insulin?: no  Long acting insulin:  Short acting insulin: Blood Pressure Monitoring: weekly Retinal Examination: Up to Date Foot Exam: Up to Date Pneumovax: Up to Date Influenza: Up to Date Aspirin: yes   HYPOTHYROIDISM Current Levothyroxine dose 100 MCG daily and last TSH 11/07/17 was 1.740. Thyroid control status:controlled Satisfied with current treatment? no Medication side effects: no Medication compliance: good compliance Etiology of hypothyroidism:  Recent dose adjustment:no Fatigue: no Cold intolerance: no Heat intolerance: no Weight gain: no Weight loss: no Constipation: no Diarrhea/loose stools: no Palpitations: no Lower extremity edema: no Anxiety/depressed mood: no  Relevant past medical, surgical, family and social history reviewed and updated as indicated. Interim medical history since our last visit reviewed. Allergies and medications reviewed and updated.  Review of Systems  Constitutional: Negative for activity change, appetite change, diaphoresis, fatigue and fever.  Respiratory: Negative for cough, chest tightness and shortness of breath.   Cardiovascular: Negative for chest pain, palpitations and leg swelling.  Gastrointestinal: Negative for abdominal distention, abdominal pain, constipation, diarrhea, nausea and vomiting.  Endocrine: Negative for cold intolerance, heat intolerance, polydipsia, polyphagia and polyuria.  Neurological: Negative for dizziness, syncope, weakness, light-headedness, numbness and headaches.  Psychiatric/Behavioral: Negative.     Per HPI unless specifically indicated above  Objective:    BP 134/74   Pulse 62   Temp 98.2 F (36.8 C) (Oral)    Ht 5' 4.5" (1.638 m)   Wt 162 lb (73.5 kg)   LMP  (LMP Unknown)   BMI 27.38 kg/m   Wt Readings from Last 3 Encounters:  05/11/18 162 lb (73.5 kg)  11/07/17 168 lb 8 oz (76.4 kg)  07/01/17 171 lb 12.8 oz (77.9 kg)    Physical Exam   Unable to perform due to technical difficulties with patient's video access.  Results for orders placed or performed in visit on 11/07/17  Comprehensive metabolic panel  Result Value Ref Range   Glucose 126 (H) 65 - 99 mg/dL   BUN 21 8 - 27 mg/dL   Creatinine, Ser 2.42 0.57 - 1.00 mg/dL   GFR calc non Af Amer 93 >59 mL/min/1.73   GFR calc Af Amer 107 >59 mL/min/1.73   BUN/Creatinine Ratio 31 (H) 12 - 28   Sodium 141 134 - 144 mmol/L   Potassium 3.7 3.5 - 5.2 mmol/L   Chloride 99 96 - 106 mmol/L   CO2 24 20 - 29 mmol/L   Calcium 9.8 8.7 - 10.3 mg/dL   Total Protein 6.4 6.0 - 8.5 g/dL   Albumin 4.3 3.6 - 4.8 g/dL   Globulin, Total 2.1 1.5 - 4.5 g/dL   Albumin/Globulin Ratio 2.0 1.2 - 2.2   Bilirubin Total 0.4 0.0 - 1.2 mg/dL   Alkaline Phosphatase 50 39 - 117 IU/L   AST 15 0 - 40 IU/L   ALT 15 0 - 32 IU/L  Lipid Panel w/o Chol/HDL Ratio  Result Value Ref Range   Cholesterol, Total 137 100 - 199 mg/dL   Triglycerides 683 (H) 0 - 149 mg/dL   HDL 47 >41 mg/dL   VLDL Cholesterol Cal 32 5 - 40 mg/dL   LDL Calculated 58 0 - 99 mg/dL  TSH  Result Value Ref Range   TSH 1.740 0.450 - 4.500 uIU/mL  HgB A1c  Result Value Ref Range   Hgb A1c MFr Bld 7.1 (H) 4.8 - 5.6 %   Est. average glucose Bld gHb Est-mCnc 157 mg/dL      Assessment & Plan:   Problem List Items Addressed This Visit      Cardiovascular and Mediastinum   Hypertension    Chronic, ongoing.  Continue current medication regimen.  BP at goal on home reading today.  Will obtain outpatient labs, CMP, + review with patient via phone call once resulted.      Relevant Orders   CBC with Differential/Platelet   Comprehensive metabolic panel     Endocrine   Diabetes mellitus (HCC) -  Primary    Chronic, ongoing.  Continue current medication regimen.  Will obtain outpatient labs, A1C and urine micro, + review with patient via phone call once resulted.      Relevant Orders   Bayer DCA Hb A1c Waived   Microalbumin, Urine Waived   Hypothyroidism    Chronic, ongoing.  Continue current medication regimen.  Will obtain outpatient labs, TSH, + review with patient via phone call once resulted.  Adjust medication dose as needed based on lab results.      Relevant Orders   TSH     Other   Hypercholesterolemia    Chronic, ongoing.  Continue current medication regimen.  Will obtain outpatient labs, lipid panel, + review with patient via phone call once resulted.      Relevant Orders  Comprehensive metabolic panel   Lipid Panel w/o Chol/HDL Ratio      I discussed the assessment and treatment plan with the patient. The patient was provided an opportunity to ask questions and all were answered. The patient agreed with the plan and demonstrated an understanding of the instructions.  The patient was advised to call back or seek an in-person evaluation if the symptoms worsen or if the condition fails to improve as anticipated.  I provided 15 minutes of time during this encounter.  Follow up plan: Return in about 3 months (around 08/10/2018) for T2DM, HTN/HLD, Hypothyroid.

## 2018-05-11 NOTE — Assessment & Plan Note (Signed)
Chronic, ongoing.  Continue current medication regimen.  Will obtain outpatient labs, A1C and urine micro, + review with patient via phone call once resulted.

## 2018-05-11 NOTE — Assessment & Plan Note (Signed)
Chronic, ongoing.  Continue current medication regimen.  BP at goal on home reading today.  Will obtain outpatient labs, CMP, + review with patient via phone call once resulted.

## 2018-05-11 NOTE — Telephone Encounter (Signed)
Called pt to schedule labs no answer on cell voicemail full. Called home phone left message to call office back.

## 2018-05-11 NOTE — Patient Instructions (Signed)
Carbohydrate Counting for Diabetes Mellitus, Adult  Carbohydrate counting is a method of keeping track of how many carbohydrates you eat. Eating carbohydrates naturally increases the amount of sugar (glucose) in the blood. Counting how many carbohydrates you eat helps keep your blood glucose within normal limits, which helps you manage your diabetes (diabetes mellitus). It is important to know how many carbohydrates you can safely have in each meal. This is different for every person. A diet and nutrition specialist (registered dietitian) can help you make a meal plan and calculate how many carbohydrates you should have at each meal and snack. Carbohydrates are found in the following foods:  Grains, such as breads and cereals.  Dried beans and soy products.  Starchy vegetables, such as potatoes, peas, and corn.  Fruit and fruit juices.  Milk and yogurt.  Sweets and snack foods, such as cake, cookies, candy, chips, and soft drinks. How do I count carbohydrates? There are two ways to count carbohydrates in food. You can use either of the methods or a combination of both. Reading "Nutrition Facts" on packaged food The "Nutrition Facts" list is included on the labels of almost all packaged foods and beverages in the [place] It includes:  The serving size.  Information about nutrients in each serving, including the grams (g) of carbohydrate per serving. To use the "Nutrition Facts":  Decide how many servings you will have.  Multiply the number of servings by the number of carbohydrates per serving.  The resulting number is the total amount of carbohydrates that you will be having. Learning standard serving sizes of other foods When you eat carbohydrate foods that are not packaged or do not include "Nutrition Facts" on the label, you need to measure the servings in order to count the amount of carbohydrates:  Measure the foods that you will eat with a food scale or measuring cup, if needed.   Decide how many standard-size servings you will eat.  Multiply the number of servings by 15. Most carbohydrate-rich foods have about 15 g of carbohydrates per serving. ? For example, if you eat 8 oz (170 g) of strawberries, you will have eaten 2 servings and 30 g of carbohydrates (2 servings x 15 g = 30 g).  For foods that have more than one food mixed, such as soups and casseroles, you must count the carbohydrates in each food that is included. The following list contains standard serving sizes of common carbohydrate-rich foods. Each of these servings has about 15 g of carbohydrates:   hamburger bun or  English muffin.   oz (15 mL) syrup.   oz (14 g) jelly.  1 slice of bread.  1 six-inch tortilla.  3 oz (85 g) cooked rice or pasta.  4 oz (113 g) cooked dried beans.  4 oz (113 g) starchy vegetable, such as peas, corn, or potatoes.  4 oz (113 g) hot cereal.  4 oz (113 g) mashed potatoes or  of a large baked potato.  4 oz (113 g) canned or frozen fruit.  4 oz (120 mL) fruit juice.  4-6 crackers.  6 chicken nuggets.  6 oz (170 g) unsweetened dry cereal.  6 oz (170 g) plain fat-free yogurt or yogurt sweetened with artificial sweeteners.  8 oz (240 mL) milk.  8 oz (170 g) fresh fruit or one small piece of fruit.  24 oz (680 g) popped popcorn. Example of carbohydrate counting Sample meal  3 oz (85 g) chicken breast.  6 oz (170 g)   brown rice.  4 oz (113 g) corn.  8 oz (240 mL) milk.  8 oz (170 g) strawberries with sugar-free whipped topping. Carbohydrate calculation 1. Identify the foods that contain carbohydrates: ? Rice. ? Corn. ? Milk. ? Strawberries. 2. Calculate how many servings you have of each food: ? 2 servings rice. ? 1 serving corn. ? 1 serving milk. ? 1 serving strawberries. 3. Multiply each number of servings by 15 g: ? 2 servings rice x 15 g = 30 g. ? 1 serving corn x 15 g = 15 g. ? 1 serving milk x 15 g = 15 g. ? 1 serving  strawberries x 15 g = 15 g. 4. Add together all of the amounts to find the total grams of carbohydrates eaten: ? 30 g + 15 g + 15 g + 15 g = 75 g of carbohydrates total. Summary  Carbohydrate counting is a method of keeping track of how many carbohydrates you eat.  Eating carbohydrates naturally increases the amount of sugar (glucose) in the blood.  Counting how many carbohydrates you eat helps keep your blood glucose within normal limits, which helps you manage your diabetes.  A diet and nutrition specialist (registered dietitian) can help you make a meal plan and calculate how many carbohydrates you should have at each meal and snack. This information is not intended to replace advice given to you by your health care provider. Make sure you discuss any questions you have with your health care provider. Document Released: 01/21/2005 Document Revised: 07/31/2016 Document Reviewed: 07/05/2015 Elsevier Interactive Patient Education  2019 [employer]  

## 2018-05-11 NOTE — Assessment & Plan Note (Signed)
Chronic, ongoing.  Continue current medication regimen.  Will obtain outpatient labs, TSH, + review with patient via phone call once resulted.  Adjust medication dose as needed based on lab results.

## 2018-05-11 NOTE — Assessment & Plan Note (Signed)
Chronic, ongoing.  Continue current medication regimen.  Will obtain outpatient labs, lipid panel, + review with patient via phone call once resulted.

## 2018-05-12 ENCOUNTER — Telehealth: Payer: Self-pay | Admitting: Nurse Practitioner

## 2018-05-12 NOTE — Telephone Encounter (Signed)
Perfect.  Her labs have been ordered.  Thanks

## 2018-05-12 NOTE — Telephone Encounter (Signed)
Scheduled pt for labs 4/9

## 2018-05-14 ENCOUNTER — Telehealth: Payer: Self-pay | Admitting: Nurse Practitioner

## 2018-05-14 ENCOUNTER — Other Ambulatory Visit: Payer: BC Managed Care – PPO

## 2018-05-14 ENCOUNTER — Encounter: Payer: Self-pay | Admitting: Nurse Practitioner

## 2018-05-14 ENCOUNTER — Other Ambulatory Visit: Payer: Self-pay

## 2018-05-14 DIAGNOSIS — E119 Type 2 diabetes mellitus without complications: Secondary | ICD-10-CM

## 2018-05-14 DIAGNOSIS — E039 Hypothyroidism, unspecified: Secondary | ICD-10-CM

## 2018-05-14 DIAGNOSIS — I10 Essential (primary) hypertension: Secondary | ICD-10-CM

## 2018-05-14 DIAGNOSIS — E7800 Pure hypercholesterolemia, unspecified: Secondary | ICD-10-CM

## 2018-05-14 LAB — BAYER DCA HB A1C WAIVED: HB A1C (BAYER DCA - WAIVED): 7.5 % — ABNORMAL HIGH (ref <7.0)

## 2018-05-14 LAB — MICROALBUMIN, URINE WAIVED
Creatinine, Urine Waived: 50 mg/dL (ref 10–300)
Microalb, Ur Waived: 80 mg/L — ABNORMAL HIGH (ref 0–19)

## 2018-05-14 NOTE — Telephone Encounter (Signed)
Reviewed A1C result of 7.5% with patient, previous was 7.0% 10 months ago.  Discussed current medication regimen of Jardiance and Metformin.  She is unable to take max dose Metformin due to GI issues, takes 500 MG twice a day.  Endorses she has been poorly focused on diet for several months and would like to try focusing more on diet and exercise at this time.  Educated her on GLP injectables.  She was interested in this, will further discuss with her in 3 months if continues to have A1C above goal of 7.  Consider addition of GLP at next visit if A1C >7.  Patient agreed with POC and all questions answered.

## 2018-05-15 LAB — CBC WITH DIFFERENTIAL/PLATELET
Basophils Absolute: 0.1 10*3/uL (ref 0.0–0.2)
Basos: 1 %
EOS (ABSOLUTE): 0.2 10*3/uL (ref 0.0–0.4)
Eos: 5 %
Hematocrit: 44.2 % (ref 34.0–46.6)
Hemoglobin: 15.4 g/dL (ref 11.1–15.9)
Immature Grans (Abs): 0 10*3/uL (ref 0.0–0.1)
Immature Granulocytes: 0 %
Lymphocytes Absolute: 1.4 10*3/uL (ref 0.7–3.1)
Lymphs: 29 %
MCH: 30.4 pg (ref 26.6–33.0)
MCHC: 34.8 g/dL (ref 31.5–35.7)
MCV: 87 fL (ref 79–97)
Monocytes Absolute: 0.4 10*3/uL (ref 0.1–0.9)
Monocytes: 9 %
Neutrophils Absolute: 2.8 10*3/uL (ref 1.4–7.0)
Neutrophils: 56 %
Platelets: 286 10*3/uL (ref 150–450)
RBC: 5.07 x10E6/uL (ref 3.77–5.28)
RDW: 12.1 % (ref 11.7–15.4)
WBC: 5 10*3/uL (ref 3.4–10.8)

## 2018-05-15 LAB — COMPREHENSIVE METABOLIC PANEL
ALT: 12 IU/L (ref 0–32)
AST: 14 IU/L (ref 0–40)
Albumin/Globulin Ratio: 2.3 — ABNORMAL HIGH (ref 1.2–2.2)
Albumin: 4.3 g/dL (ref 3.8–4.8)
Alkaline Phosphatase: 49 IU/L (ref 39–117)
BUN/Creatinine Ratio: 33 — ABNORMAL HIGH (ref 12–28)
BUN: 23 mg/dL (ref 8–27)
Bilirubin Total: 0.6 mg/dL (ref 0.0–1.2)
CO2: 24 mmol/L (ref 20–29)
Calcium: 9.5 mg/dL (ref 8.7–10.3)
Chloride: 102 mmol/L (ref 96–106)
Creatinine, Ser: 0.7 mg/dL (ref 0.57–1.00)
GFR calc Af Amer: 106 mL/min/{1.73_m2} (ref >59)
GFR calc non Af Amer: 92 mL/min/{1.73_m2} (ref >59)
Globulin, Total: 1.9 g/dL (ref 1.5–4.5)
Glucose: 146 mg/dL — ABNORMAL HIGH (ref 65–99)
Potassium: 4.5 mmol/L (ref 3.5–5.2)
Sodium: 145 mmol/L — ABNORMAL HIGH (ref 134–144)
Total Protein: 6.2 g/dL (ref 6.0–8.5)

## 2018-05-15 LAB — LIPID PANEL W/O CHOL/HDL RATIO
Cholesterol, Total: 161 mg/dL (ref 100–199)
HDL: 54 mg/dL (ref >39)
LDL Calculated: 85 mg/dL (ref 0–99)
Triglycerides: 111 mg/dL (ref 0–149)
VLDL Cholesterol Cal: 22 mg/dL (ref 5–40)

## 2018-05-15 LAB — TSH: TSH: 2.97 u[IU]/mL (ref 0.450–4.500)

## 2018-07-20 ENCOUNTER — Other Ambulatory Visit: Payer: Self-pay | Admitting: Unknown Physician Specialty

## 2018-07-20 ENCOUNTER — Other Ambulatory Visit: Payer: Self-pay | Admitting: Nurse Practitioner

## 2018-08-27 LAB — HM DIABETES EYE EXAM

## 2019-05-12 IMAGING — DX DG KNEE 1-2V PORT*L*
2 series · 2 of 2 positions shown · non-contrast
Comparison: 06/27/2011

CLINICAL DATA: Status post knee replacement

EXAM:
PORTABLE LEFT KNEE - 1-2 VIEW

[knee ap]
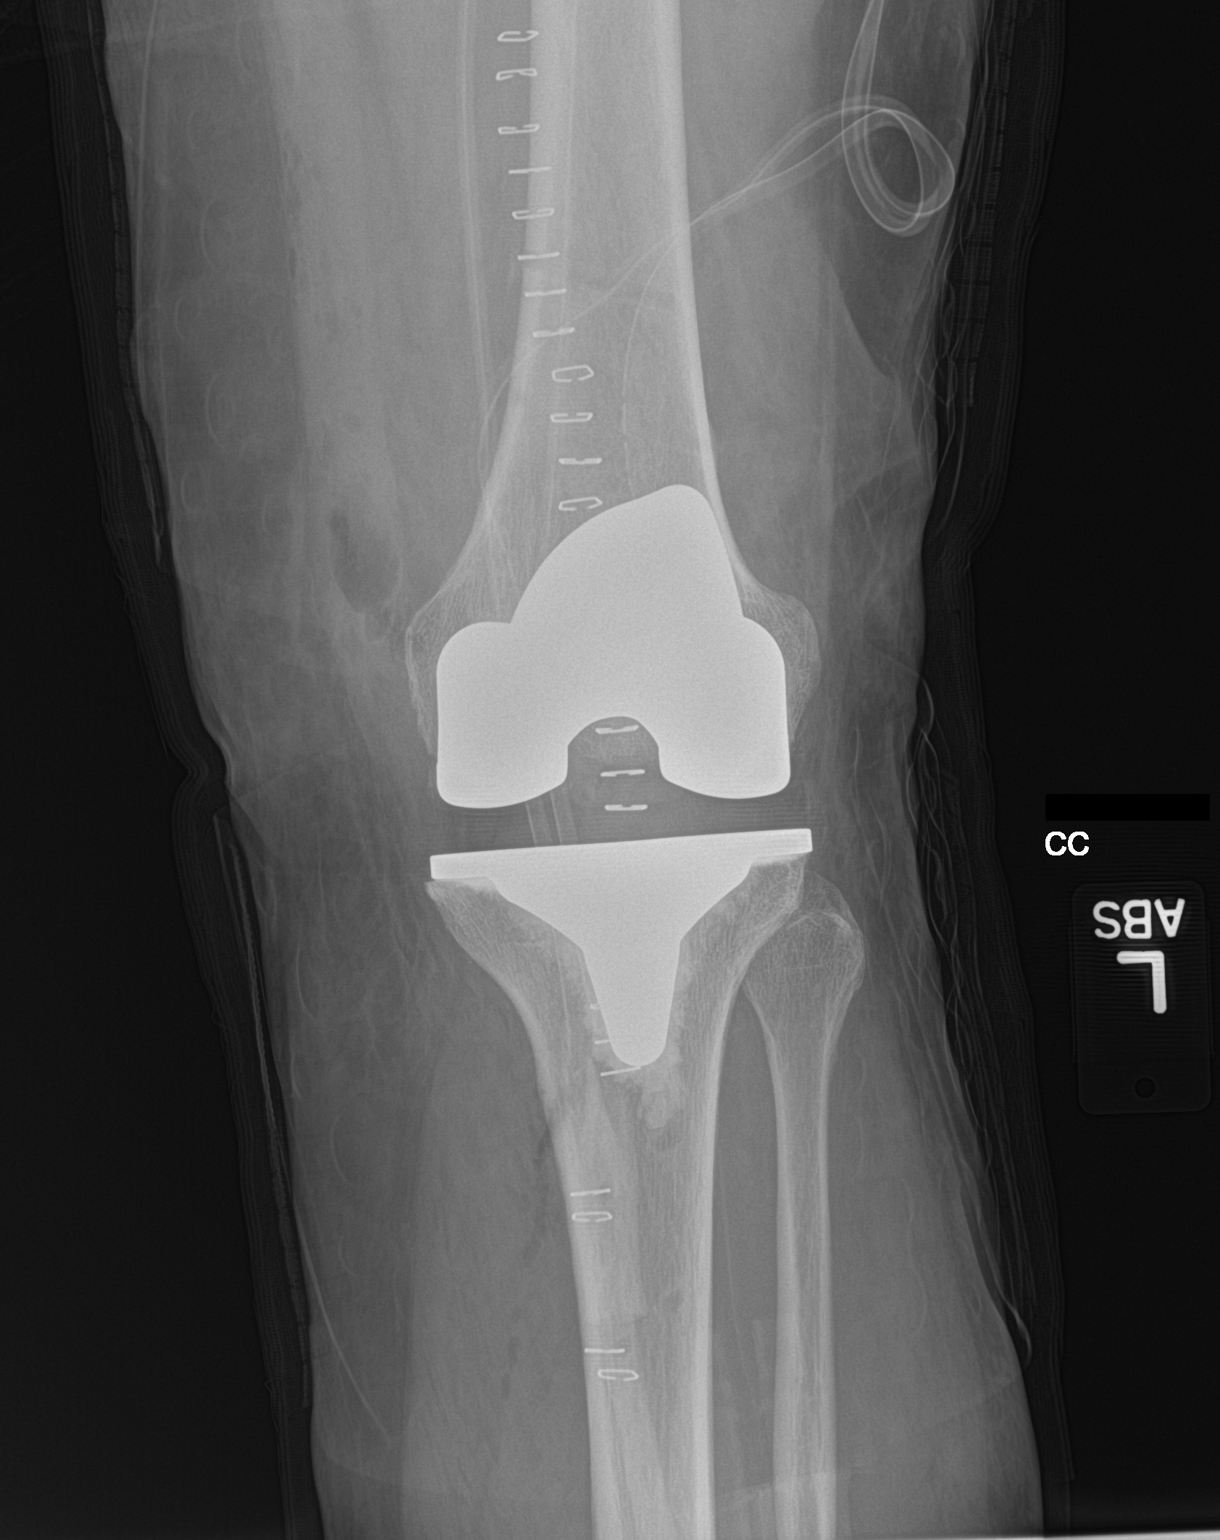

[knee lat]
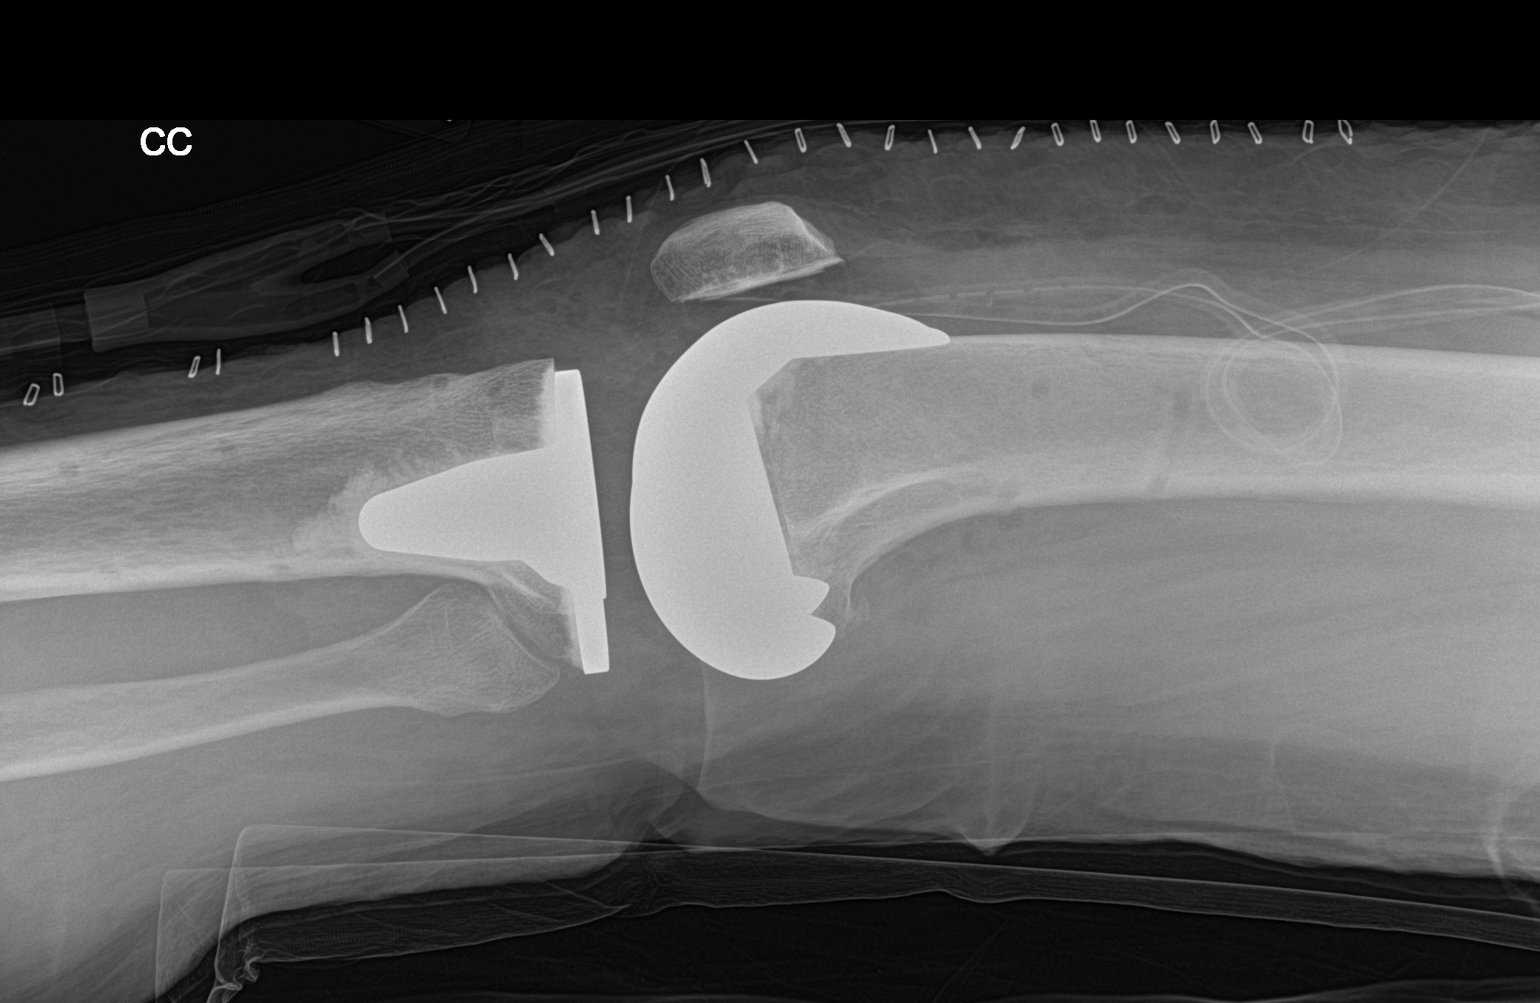

[2 of 2 positions shown; findings below may reference images not displayed]

FINDINGS: Cutaneous staples. Status post left knee replacement with normal
alignment. No fracture. Soft tissue drain in the suprapatellar
region. Scattered pockets of soft tissue gas felt consistent with
recent surgery.
IMPRESSION: Status post left knee replacement with expected postsurgical changes
